# Patient Record
Sex: Male | Born: 1960 | Race: White | Hispanic: No | Smoking: Never smoker
Health system: Southern US, Community
[De-identification: ages and names within clinical notes are randomized; demographics above are authoritative.]

## PROBLEM LIST (undated history)

## (undated) DIAGNOSIS — I209 Angina pectoris, unspecified: Secondary | ICD-10-CM

## (undated) DIAGNOSIS — I38 Endocarditis, valve unspecified: Secondary | ICD-10-CM

## (undated) DIAGNOSIS — R531 Weakness: Secondary | ICD-10-CM

## (undated) DIAGNOSIS — I10 Essential (primary) hypertension: Secondary | ICD-10-CM

## (undated) HISTORY — PX: CARDIAC CATHETERIZATION: SHX172

## (undated) HISTORY — PX: SPINAL CORD STIMULATOR IMPLANT: SHX2422

## (undated) HISTORY — PX: CHOLECYSTECTOMY: SHX55

## (undated) HISTORY — PX: KNEE SURGERY: SHX244

---

## 2015-05-16 ENCOUNTER — Emergency Department (HOSPITAL_COMMUNITY): Payer: Medicare Other

## 2015-05-16 ENCOUNTER — Observation Stay (HOSPITAL_COMMUNITY)
Admission: EM | Admit: 2015-05-16 | Discharge: 2015-05-19 | Disposition: A | Discharge status: Home / Self Care | Payer: Medicare Other | Attending: Internal Medicine | Admitting: Internal Medicine

## 2015-05-16 ENCOUNTER — Encounter (HOSPITAL_COMMUNITY): Payer: Self-pay | Admitting: Emergency Medicine

## 2015-05-16 DIAGNOSIS — E1165 Type 2 diabetes mellitus with hyperglycemia: Secondary | ICD-10-CM | POA: Diagnosis not present

## 2015-05-16 DIAGNOSIS — J45909 Unspecified asthma, uncomplicated: Secondary | ICD-10-CM | POA: Insufficient documentation

## 2015-05-16 DIAGNOSIS — I639 Cerebral infarction, unspecified: Secondary | ICD-10-CM | POA: Diagnosis present

## 2015-05-16 DIAGNOSIS — R072 Precordial pain: Secondary | ICD-10-CM | POA: Insufficient documentation

## 2015-05-16 DIAGNOSIS — G43911 Migraine, unspecified, intractable, with status migrainosus: Secondary | ICD-10-CM

## 2015-05-16 DIAGNOSIS — R079 Chest pain, unspecified: Secondary | ICD-10-CM | POA: Insufficient documentation

## 2015-05-16 DIAGNOSIS — I493 Ventricular premature depolarization: Secondary | ICD-10-CM | POA: Diagnosis not present

## 2015-05-16 DIAGNOSIS — H8122 Vestibular neuronitis, left ear: Secondary | ICD-10-CM | POA: Diagnosis not present

## 2015-05-16 DIAGNOSIS — R42 Dizziness and giddiness: Secondary | ICD-10-CM | POA: Insufficient documentation

## 2015-05-16 DIAGNOSIS — F17220 Nicotine dependence, chewing tobacco, uncomplicated: Secondary | ICD-10-CM | POA: Insufficient documentation

## 2015-05-16 DIAGNOSIS — R269 Unspecified abnormalities of gait and mobility: Secondary | ICD-10-CM | POA: Insufficient documentation

## 2015-05-16 DIAGNOSIS — Z720 Tobacco use: Secondary | ICD-10-CM | POA: Diagnosis present

## 2015-05-16 DIAGNOSIS — H8120 Vestibular neuronitis, unspecified ear: Secondary | ICD-10-CM | POA: Diagnosis present

## 2015-05-16 DIAGNOSIS — H933X9 Disorders of unspecified acoustic nerve: Secondary | ICD-10-CM | POA: Insufficient documentation

## 2015-05-16 DIAGNOSIS — R531 Weakness: Secondary | ICD-10-CM | POA: Diagnosis present

## 2015-05-16 DIAGNOSIS — R739 Hyperglycemia, unspecified: Secondary | ICD-10-CM | POA: Diagnosis present

## 2015-05-16 DIAGNOSIS — R0789 Other chest pain: Secondary | ICD-10-CM | POA: Insufficient documentation

## 2015-05-16 DIAGNOSIS — R030 Elevated blood-pressure reading, without diagnosis of hypertension: Secondary | ICD-10-CM | POA: Diagnosis not present

## 2015-05-16 DIAGNOSIS — G510 Bell's palsy: Secondary | ICD-10-CM | POA: Diagnosis not present

## 2015-05-16 DIAGNOSIS — I2510 Atherosclerotic heart disease of native coronary artery without angina pectoris: Secondary | ICD-10-CM | POA: Diagnosis not present

## 2015-05-16 DIAGNOSIS — M6289 Other specified disorders of muscle: Secondary | ICD-10-CM | POA: Diagnosis not present

## 2015-05-16 DIAGNOSIS — M62830 Muscle spasm of back: Secondary | ICD-10-CM | POA: Diagnosis not present

## 2015-05-16 DIAGNOSIS — R55 Syncope and collapse: Principal | ICD-10-CM | POA: Insufficient documentation

## 2015-05-16 DIAGNOSIS — E785 Hyperlipidemia, unspecified: Secondary | ICD-10-CM | POA: Insufficient documentation

## 2015-05-16 DIAGNOSIS — E119 Type 2 diabetes mellitus without complications: Secondary | ICD-10-CM | POA: Insufficient documentation

## 2015-05-16 DIAGNOSIS — K219 Gastro-esophageal reflux disease without esophagitis: Secondary | ICD-10-CM | POA: Insufficient documentation

## 2015-05-16 HISTORY — DX: Angina pectoris, unspecified: I20.9

## 2015-05-16 HISTORY — DX: Weakness: R53.1

## 2015-05-16 HISTORY — DX: Endocarditis, valve unspecified: I38

## 2015-05-16 HISTORY — DX: Essential (primary) hypertension: I10

## 2015-05-16 LAB — HEPATIC FUNCTION PANEL
ALBUMIN: 3.4 g/dL — AB (ref 3.5–5.0)
ALK PHOS: 74 U/L (ref 38–126)
ALT: 34 U/L (ref 17–63)
AST: 25 U/L (ref 15–41)
Bilirubin, Direct: 0.1 mg/dL (ref 0.1–0.5)
Indirect Bilirubin: 0.6 mg/dL (ref 0.3–0.9)
TOTAL PROTEIN: 6.5 g/dL (ref 6.5–8.1)
Total Bilirubin: 0.7 mg/dL (ref 0.3–1.2)

## 2015-05-16 LAB — CBC WITH DIFFERENTIAL/PLATELET
BASOS ABS: 0.1 10*3/uL (ref 0.0–0.1)
Basophils Relative: 1 %
EOS PCT: 2 %
Eosinophils Absolute: 0.2 10*3/uL (ref 0.0–0.7)
HEMATOCRIT: 45.6 % (ref 39.0–52.0)
HEMOGLOBIN: 15 g/dL (ref 13.0–17.0)
LYMPHS PCT: 24 %
Lymphs Abs: 2.4 10*3/uL (ref 0.7–4.0)
MCH: 30.2 pg (ref 26.0–34.0)
MCHC: 32.9 g/dL (ref 30.0–36.0)
MCV: 91.8 fL (ref 78.0–100.0)
Monocytes Absolute: 1.2 10*3/uL — ABNORMAL HIGH (ref 0.1–1.0)
Monocytes Relative: 12 %
NEUTROS ABS: 6.3 10*3/uL (ref 1.7–7.7)
NEUTROS PCT: 61 %
PLATELETS: 238 10*3/uL (ref 150–400)
RBC: 4.97 MIL/uL (ref 4.22–5.81)
RDW: 13.1 % (ref 11.5–15.5)
WBC: 10.1 10*3/uL (ref 4.0–10.5)

## 2015-05-16 LAB — BASIC METABOLIC PANEL
ANION GAP: 6 (ref 5–15)
BUN: 14 mg/dL (ref 6–20)
CHLORIDE: 108 mmol/L (ref 101–111)
CO2: 25 mmol/L (ref 22–32)
Calcium: 9.1 mg/dL (ref 8.9–10.3)
Creatinine, Ser: 0.92 mg/dL (ref 0.61–1.24)
GFR calc Af Amer: >60 mL/min (ref >60)
GLUCOSE: 119 mg/dL — AB (ref 65–99)
POTASSIUM: 3.9 mmol/L (ref 3.5–5.1)
Sodium: 139 mmol/L (ref 135–145)

## 2015-05-16 LAB — GLUCOSE, CAPILLARY
Glucose-Capillary: 137 mg/dL — ABNORMAL HIGH (ref 65–99)
Glucose-Capillary: 180 mg/dL — ABNORMAL HIGH (ref 65–99)

## 2015-05-16 LAB — TROPONIN I
Troponin I: <0.03 ng/mL (ref <0.031)
Troponin I: <0.03 ng/mL (ref <0.031)

## 2015-05-16 LAB — CBG MONITORING, ED: GLUCOSE-CAPILLARY: 125 mg/dL — AB (ref 65–99)

## 2015-05-16 LAB — TSH: TSH: 0.928 u[IU]/mL (ref 0.350–4.500)

## 2015-05-16 MED ORDER — SODIUM CHLORIDE 0.9 % IV SOLN
INTRAVENOUS | Status: DC
  Administered 2015-05-16 – 2015-05-19 (×3): via INTRAVENOUS

## 2015-05-16 MED ORDER — ONDANSETRON HCL 4 MG/2ML IJ SOLN
4.0000 mg | Freq: Once | INTRAMUSCULAR | Status: AC
  Administered 2015-05-16: 4 mg via INTRAVENOUS
  Filled 2015-05-16: qty 2

## 2015-05-16 MED ORDER — VALACYCLOVIR HCL 500 MG PO TABS
1000.0000 mg | ORAL_TABLET | Freq: Three times a day (TID) | ORAL | Status: DC
  Administered 2015-05-16 – 2015-05-19 (×8): 1000 mg via ORAL
  Filled 2015-05-16 (×8): qty 2

## 2015-05-16 MED ORDER — ENOXAPARIN SODIUM 40 MG/0.4ML ~~LOC~~ SOLN
40.0000 mg | SUBCUTANEOUS | Status: DC
  Administered 2015-05-16 – 2015-05-18 (×3): 40 mg via SUBCUTANEOUS
  Filled 2015-05-16 (×2): qty 0.4

## 2015-05-16 MED ORDER — SODIUM CHLORIDE 0.9 % IJ SOLN
3.0000 mL | Freq: Two times a day (BID) | INTRAMUSCULAR | Status: DC
  Administered 2015-05-18: 3 mL via INTRAVENOUS

## 2015-05-16 MED ORDER — ASPIRIN EC 325 MG PO TBEC
325.0000 mg | DELAYED_RELEASE_TABLET | Freq: Every day | ORAL | Status: DC
  Administered 2015-05-17 – 2015-05-19 (×3): 325 mg via ORAL
  Filled 2015-05-16 (×3): qty 1

## 2015-05-16 MED ORDER — HYDROCODONE-ACETAMINOPHEN 5-325 MG PO TABS
1.0000 | ORAL_TABLET | ORAL | Status: DC | PRN
  Administered 2015-05-17: 1 via ORAL
  Filled 2015-05-16: qty 1

## 2015-05-16 MED ORDER — MECLIZINE HCL 25 MG PO TABS
25.0000 mg | ORAL_TABLET | Freq: Once | ORAL | Status: AC
  Administered 2015-05-16: 25 mg via ORAL
  Filled 2015-05-16: qty 1

## 2015-05-16 MED ORDER — NITROGLYCERIN 0.4 MG SL SUBL: 0.4000 mg | SUBLINGUAL_TABLET | SUBLINGUAL | Status: DC | PRN

## 2015-05-16 MED ORDER — DEXTROSE 5 % IV SOLN
125.0000 mg | Freq: Four times a day (QID) | INTRAVENOUS | Status: DC
  Filled 2015-05-16 (×3): qty 1.25

## 2015-05-16 MED ORDER — NITROGLYCERIN IN D5W 200-5 MCG/ML-% IV SOLN
0.0000 ug/min | Freq: Once | INTRAVENOUS | Status: AC
  Administered 2015-05-16: 5 ug/min via INTRAVENOUS
  Filled 2015-05-16: qty 250

## 2015-05-16 MED ORDER — SODIUM CHLORIDE 0.9 % IJ SOLN: 3.0000 mL | Freq: Two times a day (BID) | INTRAMUSCULAR | Status: DC

## 2015-05-16 MED ORDER — BACLOFEN 5 MG HALF TABLET
5.0000 mg | ORAL_TABLET | Freq: Three times a day (TID) | ORAL | Status: DC
  Filled 2015-05-16 (×2): qty 1

## 2015-05-16 MED ORDER — POLYETHYLENE GLYCOL 3350 17 G PO PACK
17.0000 g | PACK | Freq: Every day | ORAL | Status: DC | PRN
  Administered 2015-05-17 – 2015-05-18 (×2): 17 g via ORAL
  Filled 2015-05-16 (×2): qty 1

## 2015-05-16 MED ORDER — BACLOFEN 10 MG PO TABS: 5.0000 mg | ORAL_TABLET | Freq: Three times a day (TID) | ORAL | Status: DC | PRN

## 2015-05-16 MED ORDER — KETOROLAC TROMETHAMINE 15 MG/ML IJ SOLN
15.0000 mg | Freq: Once | INTRAMUSCULAR | Status: AC
  Administered 2015-05-16: 15 mg via INTRAVENOUS
  Filled 2015-05-16: qty 1

## 2015-05-16 MED ORDER — SODIUM CHLORIDE 0.9 % IJ SOLN: 3.0000 mL | INTRAMUSCULAR | Status: DC | PRN

## 2015-05-16 MED ORDER — ACETAMINOPHEN 325 MG PO TABS: 650.0000 mg | ORAL_TABLET | Freq: Four times a day (QID) | ORAL | Status: DC | PRN

## 2015-05-16 MED ORDER — VALPROATE SODIUM 500 MG/5ML IV SOLN
125.0000 mg | Freq: Four times a day (QID) | INTRAVENOUS | Status: DC | PRN
  Filled 2015-05-16 (×2): qty 1.25

## 2015-05-16 MED ORDER — VALACYCLOVIR HCL 500 MG PO TABS: 1000.0000 mg | ORAL_TABLET | Freq: Three times a day (TID) | ORAL | Status: DC

## 2015-05-16 MED ORDER — SODIUM CHLORIDE 0.9 % IV SOLN
250.0000 mg | Freq: Four times a day (QID) | INTRAVENOUS | Status: DC
  Filled 2015-05-16 (×4): qty 2

## 2015-05-16 MED ORDER — MECLIZINE HCL 25 MG PO TABS
25.0000 mg | ORAL_TABLET | Freq: Three times a day (TID) | ORAL | Status: DC | PRN
  Administered 2015-05-18: 25 mg via ORAL
  Filled 2015-05-16 (×4): qty 1

## 2015-05-16 MED ORDER — SODIUM CHLORIDE 0.9 % IV SOLN
250.0000 mg | Freq: Four times a day (QID) | INTRAVENOUS | Status: AC
  Administered 2015-05-16 – 2015-05-17 (×4): 250 mg via INTRAVENOUS
  Filled 2015-05-16 (×4): qty 2

## 2015-05-16 MED ORDER — SODIUM CHLORIDE 0.9 % IV SOLN: 250.0000 mL | INTRAVENOUS | Status: DC | PRN

## 2015-05-16 MED ORDER — FAMOTIDINE 20 MG PO TABS
20.0000 mg | ORAL_TABLET | Freq: Two times a day (BID) | ORAL | Status: DC
  Administered 2015-05-16 – 2015-05-19 (×7): 20 mg via ORAL
  Filled 2015-05-16 (×7): qty 1

## 2015-05-16 MED ORDER — BISACODYL 10 MG RE SUPP
10.0000 mg | Freq: Every day | RECTAL | Status: DC | PRN
  Filled 2015-05-16: qty 1

## 2015-05-16 MED ORDER — IOHEXOL 350 MG/ML SOLN
80.0000 mL | Freq: Once | INTRAVENOUS | Status: AC | PRN
  Administered 2015-05-16: 80 mL via INTRAVENOUS

## 2015-05-16 MED ORDER — INSULIN ASPART 100 UNIT/ML ~~LOC~~ SOLN
0.0000 [IU] | Freq: Three times a day (TID) | SUBCUTANEOUS | Status: DC
  Administered 2015-05-17 (×2): 3 [IU] via SUBCUTANEOUS

## 2015-05-16 MED ORDER — ACETAMINOPHEN 650 MG RE SUPP: 650.0000 mg | Freq: Four times a day (QID) | RECTAL | Status: DC | PRN

## 2015-05-16 NOTE — Consult Note (Signed)
NEURO HOSPITALIST CONSULT NOTE   Requesting physician: Dr. Sunnie Nielsen    Reason for Consult: Acute vertigo, gait instability, left facial paresthesias, rule out stroke  HPI:                                                                                                                                          Dennis Gonzales is an 54 y.o. male with a spinal cord stimulator for pain control secondary to a prior accident with spine injury. He woke up this morning at about 4 AM having severe gait instability and vertigo symptoms. As the day progressed she started noticing paresthesias and altered feeling on the left side of his face. At the same time he has also been having chest pain symptoms, which she describes as being diffuse on the left paracentral area and constant for several hours worse with any movement and on palpation.  Denies vision symptoms, no speech problems he has generalized fatigue and weakness without focal weakness in upper or lower extremities and no bowel or bladder problems.  Reported having some fullness feeling in his left ear.   Past Medical History  Diagnosis Date  . Angina pectoris (HCC)   . Leaky heart valve     Past Surgical History  Procedure Laterality Date  . Cardiac catheterization    . Cholecystectomy    . Spinal cord stimulator implant    . Knee surgery      History reviewed. No pertinent family history.  Family History: Mother  Father   Social History:  reports that he has never smoked. His smokeless tobacco use includes Chew. He reports that he does not drink alcohol. His drug history is not on file.  No Known Allergies  MEDICATIONS:                                                                                                                     Prior to Admission:  (Not in a hospital admission) Scheduled: . baclofen  5 mg Oral TID  . famotidine  20 mg Oral BID  . ketorolac  15 mg Intravenous Once  . methylPREDNISolone  (SOLU-MEDROL) injection  250 mg Intravenous Q6H  . valACYclovir  1,000 mg Oral TID   Continuous: . valproate sodium  Current facility-administered medications:  .  baclofen (LIORESAL) tablet 5 mg, 5 mg, Oral, TID, Honor Fairbank Daniel Nones, MD .  famotidine (PEPCID) tablet 20 mg, 20 mg, Oral, BID, Ellene Bloodsaw Daniel Nones, MD .  ketorolac (TORADOL) 15 MG/ML injection 15 mg, 15 mg, Intravenous, Once, Shelisa Fern Daniel Nones, MD .  methylPREDNISolone sodium succinate (SOLU-MEDROL) 250 mg in sodium chloride 0.9 % 50 mL IVPB, 250 mg, Intravenous, Q6H, Leandrew Keech Daniel Nones, MD .  valACYclovir (VALTREX) tablet 1,000 mg, 1,000 mg, Oral, TID, Chanta Bauers Daniel Nones, MD .  valproate (DEPACON) 125 mg in dextrose 5 % 50 mL IVPB, 125 mg, Intravenous, 4 times per day, Smriti Barkow Daniel Nones, MD No current outpatient prescriptions on file.    ROS:                                                                                                                                       History obtained from the patient  General ROS: Positive for - fatigue,  Psychological ROS: negative for - behavioral disorder, hallucinations, memory difficulties, mood swings or suicidal ideation Ophthalmic ROS: negative for - blurry vision, double vision, eye pain or loss of vision ENT ROS: negative for - epistaxis, nasal discharge, oral lesions, sore throat, tinnitus or vertigo Allergy and Immunology ROS: negative for - hives or itchy/watery eyes Hematological and Lymphatic ROS: negative for - bleeding problems, bruising or swollen lymph nodes Endocrine ROS: negative for - galactorrhea, hair pattern changes, polydipsia/polyuria or temperature intolerance Respiratory ROS: negative for - cough, hemoptysis, shortness of breath or wheezing Cardiovascular ROS: Positive for - chest pain,  Gastrointestinal ROS: negative for - abdominal pain, diarrhea, hematemesis, nausea/vomiting or stool  incontinence Genito-Urinary ROS: negative for - dysuria, hematuria, incontinence or urinary frequency/urgency Musculoskeletal ROS: Positive for - generalized muscular skeletal pain and back pain Neurological ROS: as noted in HPI Dermatological ROS: negative for rash and skin lesion changes   Blood pressure 131/89, pulse 75, temperature 98 F (36.7 C), temperature source Oral, resp. rate 17, height 6\' 1"  (1.854 m), weight 111.131 kg (245 lb), SpO2 97 %.   Neurologic Examination:                                                                                                      HEENT-  Normocephalic, no lesions, without obvious abnormality.  Normal external eye and conjunctiva.   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   Neurological Examination  Mental Status: Alert, oriented, thought content appropriate.  Speech fluent without evidence of aphasia.  Able to follow 3 step commands without difficulty. Cranial Nerves: II:    pupils equal, round, reactive to light and accommodation III,IV, VI: ptosis not present, extra-ocular motions intact bilaterally V,VII: smile symmetric, facial light touch sensation normal bilaterally. He has mild weakness with left orbicularis oculi muscle during eye closure suggestive of a mild low motor neuron left facial weakness.  VIII: hearing normal bilaterally IX,X: uvula rises symmetrically XI: bilateral shoulder shrug XII: midline tongue extension Motor: Right : Upper extremity   5/5    Left:     Upper extremity   5/5  Lower extremity   5/5     Lower extremity   5/5 Tone and bulk:normal tone throughout; no atrophy noted Sensory: Pinprick and light touch intact throughout, bilaterally including the face Deep Tendon Reflexes: 2+ and symmetric throughout Plantars: Right: downgoing   Left: downgoing Cerebellar: normal finger-to-nose, no evidence of appendicular ataxia noted Gait: Deferred      Lab Results: Basic  Metabolic Panel:  Recent Labs Lab 05/16/15 1117  NA 139  K 3.9  CL 108  CO2 25  GLUCOSE 119*  BUN 14  CREATININE 0.92  CALCIUM 9.1    Liver Function Tests: No results for input(s): AST, ALT, ALKPHOS, BILITOT, PROT, ALBUMIN in the last 168 hours. No results for input(s): LIPASE, AMYLASE in the last 168 hours. No results for input(s): AMMONIA in the last 168 hours.  CBC:  Recent Labs Lab 05/16/15 1117  WBC 10.1  NEUTROABS 6.3  HGB 15.0  HCT 45.6  MCV 91.8  PLT 238    Cardiac Enzymes:  Recent Labs Lab 05/16/15 1117  TROPONINI <0.03    Lipid Panel: No results for input(s): CHOL, TRIG, HDL, CHOLHDL, VLDL, LDLCALC in the last 168 hours.  CBG:  Recent Labs Lab 05/16/15 1031  GLUCAP 125*    Microbiology: No results found for this or any previous visit.  Coagulation Studies: No results for input(s): LABPROT, INR in the last 72 hours.  Imaging: Ct Head Wo Contrast  05/16/2015  CLINICAL DATA:  Syncope, dizziness, chest and nausea. Left-sided numbness. EXAM: CT HEAD WITHOUT CONTRAST TECHNIQUE: Contiguous axial images were obtained from the base of the skull through the vertex without intravenous contrast. COMPARISON:  None. FINDINGS: No evidence of an acute infarct, acute hemorrhage, mass lesion, mass effect or hydrocephalus. No air-fluid levels in the visualized portions of the paranasal sinuses or mastoid air cells. No fracture. Brain was incidentally imaged post contrast administration for CT chest the same day. No areas of abnormal enhancement. IMPRESSION: No acute intracranial abnormality. Electronically Signed   By: Leanna Battles M.D.   On: 05/16/2015 12:23     Assessment/Plan: 54 year old male Patient with acute onset severe vertigo and gait instability when he woke up this morning. 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  CT of the brain, CT angiogram of the head and neck done in the ER were negative for any acute pathology. MRI could not be performed due to spinal cord stimulator.   For vestibular and left facial neuritis management, recommend IV Solu-Medrol 250 mg every 6 hours for 24 hours, due to the severity of symptoms at this time. If his symptoms improve by tomorrow, can switch to by mouth  Medrol Dosepak before discharge home. Also recommend valacyclovir by mouth 1 g 3 times a day for 7 days. Patient is been having severe migraine headache with associated photophobia which is been intractable, and associated neck and low back muscle spasms. Recommend starting IV Depacon 125 mg every 6 hours and baclofen 5 mg 3 times a day. With improved symptoms, Depacon can be switched to by mouth Depakote 250 mg twice a day for migraine prophylaxis prior to discharge and may continue baclofen 5 mg 3 times a day, if tolerated without sedative side effects.  Recommend physical therapy evaluation tomorrow prior to discharge home.  He will have cardiac workup for chest pain through the primary hospitalist team. We will continue to follow-up.

## 2015-05-16 NOTE — Progress Notes (Signed)
NURSING PROGRESS NOTE  Dennis PerchesMark Gonzales [id number] Admission Data: 05/16/2015 4:38 PM Attending Provider: Alba CoryBelkys A Regalado, MD PCP:No primary care provider on file. Code Status: FULL   Dennis PerchesMark Gonzales is a 54 y.o. male patient admitted from ED:  -No acute distress noted.  -No complaints of shortness of breath.  -No complaints of chest pain.   Cardiac Monitoring: Box #  in place. Cardiac monitor yields:  Blood pressure 131/88, pulse 78, temperature 98 F (36.7 C), temperature source Oral, resp. rate 23, height 6\' 1"  (1.854 m), weight 111.131 kg (245 lb), SpO2 97 %.   IV Fluids:  IV in place, occlusive dsg intact without redness, IV cath antecubital left, condition patent and no redness none.   Allergies:  Review of patient's allergies indicates no known allergies.  Past Medical History:   has a past medical history of Angina pectoris (HCC); Leaky heart valve; Weakness (05/16/2015 ); and Hypertension.  Past Surgical History:   has past surgical history that includes Cardiac catheterization; Cholecystectomy; Spinal cord stimulator implant; and Knee surgery.  Social History:   reports that he has never smoked. His smokeless tobacco use includes Chew. He reports that he does not drink alcohol or use illicit drugs.  Skin: Intact  Patient/Family orientated to room. Information packet given to patient/family. Admission inpatient armband information verified with patient/family to include name and date of birth and placed on patient arm. Side rails up x 2, fall assessment and education completed with patient/family. Patient/family able to verbalize understanding of risk associated with falls and verbalized understanding to call for assistance before getting out of bed. Call light within reach. Patient/family able to voice and demonstrate understanding of unit orientation instructions.    Will continue to evaluate and treat per MD orders.  Dennis Pieriniyndi Lejend Dalby, RN

## 2015-05-16 NOTE — H&P (Signed)
[facility] History and Physical                                                                                    Dennis Gonzales, is a 54 y.o. male  MRN: [id number]   DOB - 1960/12/07  Admit Date - 05/16/2015  Outpatient Primary MD for the patient is No primary care provider on file.  Referring Physician:  Dr. Fayrene Fearing, EDP  Chief Complaint:   Chief Complaint  Patient presents with  . Near Syncope     HPI  Dennis Gonzales  is a 54 y.o. male, with a history of angina and an accident that shattered spinal vertebrae and necessitated a mechanical spinal stimulator to be implanted. He presents to the ER today with severe vertigo, chest pain, and possible syncopal episode. The patient reports that over the past week he has had intermittent stinging in his left chest. He states it felt like a bee was stinging his heart. At 4:30 this morning he awoke to urinate and felt the room spinning around him. He got up later in the morning progressed on and went to computer science class. The vertigo persisted intermittently during class, but when he got up to hand in an assignment he developed severe chest pain and vertigo simultaneously. The chest pain felt like he was being jabbed with a knife. He became dizzy and was caught by one of his classmates before he hit the ground. When he woke up he was lying on his back.  He developed left-sided facial numbness which progressed into both weakness and numbness in his left face, arm, and leg.   He was brought to the ER and placed on a nitro drip. His chest pain has resolved.  His labs are essentially normal. CT head and CTA head and neck were negative for acute stroke. MR was unable to be performed due to an implanted spinal stimulator. 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    Review of Systems  HENT: Negative.   Eyes: Negative.   Respiratory: Negative.   Cardiovascular: Positive for chest pain.   Gastrointestinal: Negative.   Genitourinary: Negative.   Musculoskeletal: Positive for back pain.  Skin: Negative.   Neurological: Positive for dizziness, sensory change, focal weakness and weakness.  Endo/Heme/Allergies: Negative.   Psychiatric/Behavioral: Negative.      Past Medical History  Past Medical History  Diagnosis Date  . Angina pectoris (HCC)   . Leaky heart valve     Past Surgical History  Procedure Laterality Date  . Cardiac catheterization    . Cholecystectomy    . Spinal cord stimulator implant    . Knee surgery        Social History Social History  Substance Use Topics  . Smoking status: Never Smoker   . Smokeless tobacco: Current User    Types: Chew  . 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  Married.  Family History Father (at 44) and grandfather died with aortic aneurysms, grand mother died with stroke.  Prior to Admission medications   Not on  File    No Known Allergies  Physical Exam  Vitals  Blood pressure 119/88, pulse 75, temperature 98 F (36.7 C), temperature source Oral, resp. rate 28, height  (1.854 m), weight 111.131 kg (245 lb), SpO2 97 %.   General:  Well-developed, obese male lying in bed in NAD  Psych:  Normal affect and insight, Not Suicidal or Homicidal, Awake Alert, Oriented X 3.  Neuro:   Slight left-sided facial droop, decreased sensation in left face left arm left leg, decreased strength in left leg and left arm  ENT:  Ears and Eyes appear Normal, Conjunctivae clear, PER. Moist oral mucosa without erythema or exudates.  Neck:  Supple, No lymphadenopathy appreciated  Respiratory:  Symmetrical chest wall movement, Good air movement bilaterally, CTAB.  Cardiac:  RRR, No Murmurs, no LE edema noted, no JVD.    Abdomen:  Positive bowel sounds, Soft, Non tender, Non distended,  No masses appreciated  Skin:  No Cyanosis, Normal Skin Turgor, No Skin Rash or  Bruise.  Extremities:  Able to move all 4. 4/5 strength in each left leg and left arm,  no effusions.  Data Review  Wt Readings from Last 3 Encounters:  05/16/15 111.131 kg (245 lb)    CBC  Recent Labs Lab 05/16/15 1117  WBC 10.1  HGB 15.0  HCT 45.6  PLT 238  MCV 91.8  MCH 30.2  MCHC 32.9  RDW 13.1  LYMPHSABS 2.4  MONOABS 1.2*  EOSABS 0.2  BASOSABS 0.1    Chemistries   Recent Labs Lab 05/16/15 1117  NA 139  K 3.9  CL 108  CO2 25  GLUCOSE 119*  BUN 14  CREATININE 0.92  CALCIUM 9.1     Cardiac Enzymes  Recent Labs Lab 05/16/15 1117  TROPONINI <0.03     Imaging results:   Ct Angio Head W/cm &/or Wo Cm  05/16/2015  CLINICAL DATA:  The patient awoke with vertigo at 4:15 a.m. Numbness in the left side of the face and arm. Anterior chest pain. EXAM: CT ANGIOGRAPHY HEAD AND NECK TECHNIQUE: Multidetector CT imaging of the head and neck was performed using the standard protocol during bolus administration of intravenous contrast. Multiplanar CT image reconstructions and MIPs were obtained to evaluate the vascular anatomy. Carotid stenosis measurements (when applicable) are obtained utilizing NASCET criteria, using the distal internal carotid diameter as the denominator. CONTRAST:  80mL OMNIPAQUE IOHEXOL 350 MG/ML SOLN COMPARISON:  Noncontrast at CT from the same day. FINDINGS: CT HEAD Brain: The source images demonstrate no acute or subacute infarct. The basal ganglia are intact. The insular ribbon is within normal limits. No acute cortical infarct is present. No focal cerebellar lesions are evident. Calvarium and skull base: Within normal limits Paranasal sinuses: Clear Orbits: Unremarkable CTA NECK Aortic arch: A 3 vessel arch configuration is present. There is no focal stenosis or atherosclerotic calcification at the aorta. Right carotid system: The right common carotid artery is within normal limits. The bifurcation is unremarkable. The cervical right ICA is  normal. Left carotid system: The left common carotid artery is mildly tortuous. There is no focal stenosis. Atherosclerotic calcifications are present at the left carotid bifurcation without a significant stenosis relative to the more distal vessel. The cervical left ICA is normal. Vertebral arteries:The vertebral arteries originate from the subclavian arteries bilaterally. The vertebral arteries appear to be codominant. There is no significant stenosis or focal vascular injury in the neck. The right PICA originates just below the dural margin. Skeleton: Bone  windows demonstrate multilevel uncovertebral spurring osseous foraminal narrowing is present on the left at C3-4 and C4-5 and on the right at C3-4 and C5-6. No focal lytic or blastic lesions are present. Other neck: No focal mucosal or submucosal lesions are present. The vocal cords are midline and symmetric. The tongue base is within normal limits. The salivary glands are intact. No significant adenopathy is present. The lung apices are clear. CTA HEAD Anterior circulation: The internal carotid arteries are within normal limits at the skullbase bilaterally. The ICA termini are unremarkable. The A1 and M1 segments are normal. The anterior communicating artery is patent. MCA bifurcations are intact bilaterally. The ACA and MCA branch vessels are within normal limits. Posterior circulation: The left PICA origin is visualized and normal. The vertebrobasilar junction is within normal limits. Posterior cerebral arteries contribute to the posterior cerebral arteries bilaterally. The PCA branch vessels are within normal limits bilaterally. Venous sinuses: The dural sinuses are patent. The straight sinus and deep cerebral veins are within normal limits bilaterally. Anatomic variants: None Delayed phase: The postcontrast images demonstrate no pathologic enhancement. IMPRESSION: 1. Minimal atherosclerotic changes at the left carotid bifurcation without significant  stenosis. 2. No other focal atherosclerotic disease or stenosis. 3. Normal variant CTA circle of Willis without evidence for significant proximal stenosis, aneurysm, or branch vessel occlusion. 4. Mild degenerate changes in the cervical spine as described. Electronically Signed   By: Marin Roberts M.D.   On: 05/16/2015 13:02   Ct Head Wo Contrast  05/16/2015  CLINICAL DATA:  Syncope, dizziness, chest and nausea. Left-sided numbness. EXAM: CT HEAD WITHOUT CONTRAST TECHNIQUE: Contiguous axial images were obtained from the base of the skull through the vertex without intravenous contrast. COMPARISON:  None. FINDINGS: No evidence of an acute infarct, acute hemorrhage, mass lesion, mass effect or hydrocephalus. No air-fluid levels in the visualized portions of the paranasal sinuses or mastoid air cells. No fracture. Brain was incidentally imaged post contrast administration for CT chest the same day. No areas of abnormal enhancement. IMPRESSION: No acute intracranial abnormality. Electronically Signed   By: Leanna Battles M.D.   On: 05/16/2015 12:23   Ct Angio Neck W/cm &/or Wo/cm  05/16/2015  CLINICAL DATA:  The patient awoke with vertigo at 4:15 a.m. Numbness in the left side of the face and arm. Anterior chest pain. EXAM: CT ANGIOGRAPHY HEAD AND NECK TECHNIQUE: Multidetector CT imaging of the head and neck was performed using the standard protocol during bolus administration of intravenous contrast. Multiplanar CT image reconstructions and MIPs were obtained to evaluate the vascular anatomy. Carotid stenosis measurements (when applicable) are obtained utilizing NASCET criteria, using the distal internal carotid diameter as the denominator. CONTRAST:  80mL OMNIPAQUE IOHEXOL 350 MG/ML SOLN COMPARISON:  Noncontrast at CT from the same day. FINDINGS: CT HEAD Brain: The source images demonstrate no acute or subacute infarct. The basal ganglia are intact. The insular ribbon is within normal limits. No acute  cortical infarct is present. No focal cerebellar lesions are evident. Calvarium and skull base: Within normal limits Paranasal sinuses: Clear Orbits: Unremarkable CTA NECK Aortic arch: A 3 vessel arch configuration is present. There is no focal stenosis or atherosclerotic calcification at the aorta. Right carotid system: The right common carotid artery is within normal limits. The bifurcation is unremarkable. The cervical right ICA is normal. Left carotid system: The left common carotid artery is mildly tortuous. There is no focal stenosis. Atherosclerotic calcifications are present at the left carotid bifurcation without a significant stenosis  relative to the more distal vessel. The cervical left ICA is normal. Vertebral arteries:The vertebral arteries originate from the subclavian arteries bilaterally. The vertebral arteries appear to be codominant. There is no significant stenosis or focal vascular injury in the neck. The right PICA originates just below the dural margin. Skeleton: Bone windows demonstrate multilevel uncovertebral spurring osseous foraminal narrowing is present on the left at C3-4 and C4-5 and on the right at C3-4 and C5-6. No focal lytic or blastic lesions are present. Other neck: No focal mucosal or submucosal lesions are present. The vocal cords are midline and symmetric. The tongue base is within normal limits. The salivary glands are intact. No significant adenopathy is present. The lung apices are clear. CTA HEAD Anterior circulation: The internal carotid arteries are within normal limits at the skullbase bilaterally. The ICA termini are unremarkable. The A1 and M1 segments are normal. The anterior communicating artery is patent. MCA bifurcations are intact bilaterally. The ACA and MCA branch vessels are within normal limits. Posterior circulation: The left PICA origin is visualized and normal. The vertebrobasilar junction is within normal limits. Posterior cerebral arteries contribute to  the posterior cerebral arteries bilaterally. The PCA branch vessels are within normal limits bilaterally. Venous sinuses: The dural sinuses are patent. The straight sinus and deep cerebral veins are within normal limits bilaterally. Anatomic variants: None Delayed phase: The postcontrast images demonstrate no pathologic enhancement. IMPRESSION: 1. Minimal atherosclerotic changes at the left carotid bifurcation without significant stenosis. 2. No other focal atherosclerotic disease or stenosis. 3. Normal variant CTA circle of Willis without evidence for significant proximal stenosis, aneurysm, or branch vessel occlusion. 4. Mild degenerate changes in the cervical spine as described. Electronically Signed   By: Marin Roberts M.D.   On: 05/16/2015 13:02    My personal review of EKG: Sinus. QTC is not prolonged. Some ST changes in V2 V3 and V4. 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  Patient denies ever having chickenpox, shingles or herpes Patient was assessed by neurology who felt these symptoms were due to labyrinthitis. Unable to do MR due to spinal stimulator. CT angios head and neck shows some atherosclerotic changes in the left carotid bifurcation without significant stenosis. Will check 2-D echo, hemoglobin A1c, fasting lipids, TSH, PT/OT/speech eval. Place on 325 mg aspirin. Per neurology: Solu-Medrol 24 hours then Medrol dose pack, Valtrex 7 days. Depakote for headache, baclofen for neck pain. Have ordered diazepam PRN for vertigo.   Chest pain Sounds atypical. Could be musculoskeletal in nature.  Patient was placed on a nitro drip in there ER.  Troponin is normal Will cycle enzymes, 2-D echo, start aspirin. Recheck EKG.   Ongoing tobacco abuse He uses a tin of  "grizzly" every couple of days Will counsel cessation.     Consultants Called:    Neurology consultation appreciated.  Family Communication:    Patient is alert, orientated and understands their plan of care.  Code Status:    Full  Condition:    Guarded.  Potential Disposition:   To home 11/29 if work up is complete and vertigo is improved.  Time spent in minutes : [address]   [facility] [place]s,  [place] on 05/16/2015 at 2:10 PM Between 7am to 7pm - Pager -  [phone] After 7pm go to [email or web address] - password [email or web address] And look for the night coverage person covering me after hours

## 2015-05-16 NOTE — ED Notes (Signed)
EDP made aware of dizziness and L-sided numbness.

## 2015-05-16 NOTE — ED Notes (Signed)
EDP at bedside  

## 2015-05-16 NOTE — ED Provider Notes (Addendum)
CSN: [id number]     Arrival date & time    History   First MD Initiated Contact with Patient 05/16/15 1027     Chief Complaint  Patient presents with  . Near Syncope      HPI  Patient presents for evaluation of dizziness chest pain and nausea.  Felt well upon going to bed last night other than he states he felt some weakness and a poor appetite. He waking this morning at 04 15. He sat up. He immediately had a sensation that the room was spinning. He had to lay back down for a few minutes. He remains symptomatic, but was able to get himself to and from the bathroom and back to bed. Again awakened 6:15 and symptoms had improved. He is in school and went to his class this morning. Had intermittent episodes of vertigo with ambulation. He began to feel numb in his face and arm. He began to feel some generalized anterior chest pain is nonspecific. He presents here.  History of previous normal heart cath other than a valvular abnormality. No history of coronary artery disease. Lifetime nonsmoker. No history of hypertension diabetes.  Past Medical History  Diagnosis Date  . Angina pectoris (HCC)   . Leaky heart valve    Past Surgical History  Procedure Laterality Date  . Cardiac catheterization    . Cholecystectomy    . Spinal cord stimulator implant    . Knee surgery     History reviewed. No pertinent family history. Social History  Substance Use Topics  . Smoking status: Never Smoker   . Smokeless tobacco: Current User    Types: Chew  . Alcohol Use: No    Review of Systems  Constitutional: Negative for fever, chills, diaphoresis, appetite change and fatigue.  HENT: Negative for mouth sores, sore throat and trouble swallowing.   Eyes: Negative for visual disturbance.  Respiratory: Negative for cough, chest tightness, shortness of breath and wheezing.   Cardiovascular: Positive for chest pain.  Gastrointestinal: Positive for nausea. Negative for vomiting, abdominal pain, diarrhea  and abdominal distention.  Endocrine: Negative for polydipsia, polyphagia and polyuria.  Genitourinary: Negative for dysuria, frequency and hematuria.  Musculoskeletal: Negative for gait problem.  Skin: Negative for color change, pallor and rash.  Neurological: Positive for dizziness and numbness. Negative for syncope, light-headedness and headaches.  Hematological: Does not bruise/bleed easily.  Psychiatric/Behavioral: Negative for behavioral problems and confusion.      Allergies  Review of patient's allergies indicates no known allergies.  Home Medications   Prior to Admission medications   Not on File   BP 131/89 mmHg  Pulse 75  Temp(Src) 98 F (36.7 C) (Oral)  Resp 17  Ht  (1.854 m)  Wt 245 lb (111.131 kg)  BMI 32.33 kg/m2  SpO2 97% Physical Exam  Constitutional: He is oriented to person, place, and time. He appears well-developed and well-nourished. No distress.  HENT:  Head: Normocephalic.  Eyes: Conjunctivae are normal. Pupils are equal, round, and reactive to light. No scleral icterus.  Neck: Normal range of motion. Neck supple. No thyromegaly present.  Cardiovascular: Normal rate and regular rhythm.  Exam reveals no gallop and no friction rub.   No murmur heard. Pulmonary/Chest: Effort normal and breath sounds normal. No respiratory distress. He has no wheezes. He has no rales.  Abdominal: Soft. Bowel sounds are normal. He exhibits no distension. There is no tenderness. There is no rebound.  Musculoskeletal: Normal range of motion.  Neurological: He is  alert and oriented to person, place, and time.  Nystagmus to bilateral lateral gaze. Reports decreased sensation although slight to the left face V1 through V3. No pronator drift. Reports decreased sensation to left upper finger fingertips. Normal lower extremity neurological exam  Skin: Skin is warm and dry. No rash noted.  Psychiatric: He has a normal mood and affect. His behavior is normal.    ED Course   Procedures (including critical care time) Labs Review Labs Reviewed  CBC WITH DIFFERENTIAL/PLATELET - Abnormal; Notable for the following:    Monocytes Absolute 1.2 (*)    All other components within normal limits  BASIC METABOLIC PANEL - Abnormal; Notable for the following:    Glucose, Bld 119 (*)    All other components within normal limits  CBG MONITORING, ED - Abnormal; Notable for the following:    Glucose-Capillary 125 (*)    All other components within normal limits  TROPONIN I    Imaging Review Ct Head Wo Contrast  05/16/2015  CLINICAL DATA:  Syncope, dizziness, chest and nausea. Left-sided numbness. EXAM: CT HEAD WITHOUT CONTRAST TECHNIQUE: Contiguous axial images were obtained from the base of the skull through the vertex without intravenous contrast. COMPARISON:  None. FINDINGS: No evidence of an acute infarct, acute hemorrhage, mass lesion, mass effect or hydrocephalus. No air-fluid levels in the visualized portions of the paranasal sinuses or mastoid air cells. No fracture. Brain was incidentally imaged post contrast administration for CT chest the same day. No areas of abnormal enhancement. IMPRESSION: No acute intracranial abnormality. Electronically Signed   By: Leanna BattlesMelinda  Blietz M.D.   On: 05/16/2015 12:23   I have personally reviewed and evaluated these images and lab results as part of my medical decision-making.   EKG Interpretation None      MDM   Final diagnoses:  Weakness  Weakness    No acute stroke, or abnormal is on CT with CTA. Patient has spinal cord stimulator, thus MRI contraindicated. Seen and evaluated by neurology. Working diagnosis of bilateral labyrinthitis, and unilateral evolving Bell's palsy. He requests admission. Admission orders have been discussed. Call placed to unassigned medicine regarding disposition.    Rolland PorterMark Glendal Cassaday, MD 05/16/15 1249  Rolland PorterMark Georgeanna Radziewicz, MD 05/16/15 [phone]

## 2015-05-16 NOTE — ED Notes (Signed)
[facility]-Pt. Coming from school with a near syncopal episode today accompanied with chest pain and nausea. Pt. sts he had dizziness starting last night. Today he went to turn in an assignment and got dizziness and lowered himself to the floor. Dizziness is positional and chest pain was relieved by nitro en route. Pt. Also given 324 mg ASA en route. Pt. Reports having a lot of stress and anxiety lately from school work and family issues. Pt. Also reports some numbness in left arm. Pt. Has had an episode like this before and it was his gall bladder. EMS and school report pt. Was AOx4 throughout entire episode. Pt. Takes nitro at home for angina.

## 2015-05-16 NOTE — ED Notes (Signed)
Ordered pt a heart healthy lunch tray 

## 2015-05-17 ENCOUNTER — Inpatient Hospital Stay (HOSPITAL_COMMUNITY): Payer: Medicare Other

## 2015-05-17 ENCOUNTER — Other Ambulatory Visit (HOSPITAL_COMMUNITY): Payer: Medicare Other

## 2015-05-17 ENCOUNTER — Encounter (HOSPITAL_COMMUNITY): Payer: Self-pay | Admitting: Radiology

## 2015-05-17 DIAGNOSIS — R739 Hyperglycemia, unspecified: Secondary | ICD-10-CM | POA: Diagnosis not present

## 2015-05-17 DIAGNOSIS — M6289 Other specified disorders of muscle: Secondary | ICD-10-CM | POA: Diagnosis not present

## 2015-05-17 DIAGNOSIS — Z720 Tobacco use: Secondary | ICD-10-CM | POA: Diagnosis not present

## 2015-05-17 DIAGNOSIS — H8120 Vestibular neuronitis, unspecified ear: Secondary | ICD-10-CM | POA: Diagnosis present

## 2015-05-17 DIAGNOSIS — R079 Chest pain, unspecified: Secondary | ICD-10-CM

## 2015-05-17 DIAGNOSIS — R55 Syncope and collapse: Secondary | ICD-10-CM | POA: Diagnosis not present

## 2015-05-17 DIAGNOSIS — R072 Precordial pain: Secondary | ICD-10-CM | POA: Diagnosis not present

## 2015-05-17 DIAGNOSIS — H8122 Vestibular neuronitis, left ear: Secondary | ICD-10-CM | POA: Diagnosis not present

## 2015-05-17 LAB — BASIC METABOLIC PANEL
ANION GAP: 8 (ref 5–15)
BUN: 17 mg/dL (ref 6–20)
CHLORIDE: 107 mmol/L (ref 101–111)
CO2: 23 mmol/L (ref 22–32)
CREATININE: 1.04 mg/dL (ref 0.61–1.24)
Calcium: 9.2 mg/dL (ref 8.9–10.3)
GFR calc non Af Amer: >60 mL/min (ref >60)
Glucose, Bld: 214 mg/dL — ABNORMAL HIGH (ref 65–99)
POTASSIUM: 4.3 mmol/L (ref 3.5–5.1)
SODIUM: 138 mmol/L (ref 135–145)

## 2015-05-17 LAB — GLUCOSE, CAPILLARY
GLUCOSE-CAPILLARY: 226 mg/dL — AB (ref 65–99)
GLUCOSE-CAPILLARY: 267 mg/dL — AB (ref 65–99)
Glucose-Capillary: 219 mg/dL — ABNORMAL HIGH (ref 65–99)
Glucose-Capillary: 243 mg/dL — ABNORMAL HIGH (ref 65–99)

## 2015-05-17 LAB — HEMOGLOBIN A1C
HEMOGLOBIN A1C: 6.7 % — AB (ref 4.8–5.6)
MEAN PLASMA GLUCOSE: 146 mg/dL

## 2015-05-17 LAB — LIPID PANEL
CHOL/HDL RATIO: 5.1 ratio
CHOLESTEROL: 194 mg/dL (ref 0–200)
HDL: 38 mg/dL — AB (ref >40)
LDL Cholesterol: 141 mg/dL — ABNORMAL HIGH (ref 0–99)
TRIGLYCERIDES: 74 mg/dL (ref <150)
VLDL: 15 mg/dL (ref 0–40)

## 2015-05-17 LAB — TROPONIN I: Troponin I: <0.03 ng/mL (ref <0.031)

## 2015-05-17 MED ORDER — REGADENOSON 0.4 MG/5ML IV SOLN
0.4000 mg | Freq: Once | INTRAVENOUS | Status: AC
  Administered 2015-05-18: 0.4 mg via INTRAVENOUS
  Filled 2015-05-17: qty 5

## 2015-05-17 MED ORDER — INSULIN ASPART 100 UNIT/ML ~~LOC~~ SOLN
0.0000 [IU] | Freq: Every day | SUBCUTANEOUS | Status: DC
  Administered 2015-05-17: 3 [IU] via SUBCUTANEOUS
  Administered 2015-05-18: 2 [IU] via SUBCUTANEOUS

## 2015-05-17 MED ORDER — INSULIN ASPART 100 UNIT/ML ~~LOC~~ SOLN
0.0000 [IU] | Freq: Three times a day (TID) | SUBCUTANEOUS | Status: DC
  Administered 2015-05-17: 5 [IU] via SUBCUTANEOUS
  Administered 2015-05-18 (×3): 3 [IU] via SUBCUTANEOUS
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
## 2015-05-17 NOTE — Progress Notes (Signed)
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  CT of the brain, CT angiogram of the head and neck done in the ER were negative for any acute pathology. MRI could not be performed due to spinal cord stimulator.   patient has been standing up and try to walk to bathroom this morning when I entered the room. But he continues to report having unchanged dizziness symptoms and left-sided weakness. He expressed frustration for being unable to do an MRI study due to spinal cord stimulator.      Objective: Current vital signs: BP 145/80 mmHg  Pulse 76  Temp(Src) 97.8 F (36.6 C) (Oral)  Resp 14  Ht 6\' 1"  (1.854 m)  Wt 111.131 kg (245 lb)  BMI 32.33 kg/m2  SpO2 97% Vital signs in last 24 hours: Temp:  [97.8 F (36.6 C)-98.2 F (36.8 C)] 97.8 F (36.6 C) (11/29 0502) Pulse Rate:  [71-76] 76 (11/29 0502) Resp:  [14-16] 14 (11/29 0502) BP: (134-145)/(80-84) 145/80 mmHg (11/29 0502) SpO2:  [97 %-98 %] 97 % (11/29 0502)  Intake/Output from previous day: 11/28 0701 - 11/29 0700 In: 1004 [I.V.:900; IV Piggyback:104] Out: 350 [Urine:350] Intake/Output this shift:   Nutritional status: Diet Heart Room service appropriate?: Yes; Fluid consistency:: Thin Diet NPO time specified Except for: Ice Chips  Neurologic Exam:   Mental Status: Alert, oriented, thought content appropriate. Speech fluent without evidence of aphasia. Able to follow 3 step commands without difficulty. Cranial Nerves: II: pupils equal, round, reactive to light and accommodation III,IV, VI: ptosis not present, extra-ocular motions intact bilaterally V,VII: smile symmetric, facial light touch sensation normal bilaterally. He has mild weakness with left orbicularis oculi muscle during eye closure suggestive of a mild low motor neuron left facial weakness.  VIII:  hearing normal bilaterally IX,X: uvula rises symmetrically XI: bilateral shoulder shrug XII: midline tongue extension Motor: Right :Upper extremity 5/5Left: Upper extremity 5/5 Lower extremity 5/5Lower extremity 5/5 Tone and bulk:normal tone throughout; no atrophy noted Sensory: Pinprick and light touch intact throughout, bilaterally including the face Deep Tendon Reflexes: 2+ and symmetric throughout Plantars: Right: downgoingLeft: downgoing Cerebellar: normal finger-to-nose, no evidence of appendicular ataxia noted Gait: He is able to walk independently without any support, appears to have mild gait instability  Lab Results: Basic Metabolic Panel:  Recent Labs Lab 05/16/15 1117 05/17/15 0511  NA 139 138  K 3.9 4.3  CL 108 107  CO2 25 23  GLUCOSE 119* 214*  BUN 14 17  CREATININE 0.92 1.04  CALCIUM 9.1 9.2    Liver Function Tests:  Recent Labs Lab 05/16/15 1430  AST 25  ALT 34  ALKPHOS 74  BILITOT 0.7  PROT 6.5  ALBUMIN 3.4*   No results for input(s): LIPASE, AMYLASE in the last 168 hours. No results for input(s): AMMONIA in the last 168 hours.  CBC:  Recent Labs Lab 05/16/15 1117  WBC 10.1  NEUTROABS 6.3  HGB 15.0  HCT 45.6  MCV 91.8  PLT 238    Cardiac Enzymes:  Recent Labs Lab 05/16/15 1117 05/16/15 1430 05/16/15 1730 05/16/15 2220 05/17/15 0511  TROPONINI <0.03 <0.03 <0.03 <0.03 <0.03    Lipid Panel:  Recent Labs Lab 05/17/15 0511  CHOL 194  TRIG 74  HDL 38*  CHOLHDL 5.1  VLDL 15  LDLCALC 161141*    CBG:  Recent Labs Lab 05/16/15 1802 05/16/15 2118  05/17/15 0744 05/17/15 1209 05/17/15 1733  GLUCAP 137* 180* 219* 226* 243*    Microbiology: No results found for this or any previous visit.  Coagulation Studies: No results for input(s): LABPROT, INR in the last 72  hours.  Imaging: Ct Angio Head W/cm &/or Wo Cm  05/16/2015  CLINICAL DATA:  The patient awoke with vertigo at 4:15 a.m. Numbness in the left side of the face and arm. Anterior chest pain. EXAM: CT ANGIOGRAPHY HEAD AND NECK TECHNIQUE: Multidetector CT imaging of the head and neck was performed using the standard protocol during bolus administration of intravenous contrast. Multiplanar CT image reconstructions and MIPs were obtained to evaluate the vascular anatomy. Carotid stenosis measurements (when applicable) are obtained utilizing NASCET criteria, using the distal internal carotid diameter as the denominator. CONTRAST:  80mL OMNIPAQUE IOHEXOL 350 MG/ML SOLN COMPARISON:  Noncontrast at CT from the same day. FINDINGS: CT HEAD Brain: The source images demonstrate no acute or subacute infarct. The basal ganglia are intact. The insular ribbon is within normal limits. No acute cortical infarct is present. No focal cerebellar lesions are evident. Calvarium and skull base: Within normal limits Paranasal sinuses: Clear Orbits: Unremarkable CTA NECK Aortic arch: A 3 vessel arch configuration is present. There is no focal stenosis or atherosclerotic calcification at the aorta. Right carotid system: The right common carotid artery is within normal limits. The bifurcation is unremarkable. The cervical right ICA is normal. Left carotid system: The left common carotid artery is mildly tortuous. There is no focal stenosis. Atherosclerotic calcifications are present at the left carotid bifurcation without a significant stenosis relative to the more distal vessel. The cervical left ICA is normal. Vertebral arteries:The vertebral arteries originate from the subclavian arteries bilaterally. The vertebral arteries appear to be codominant. There is no significant stenosis or focal vascular injury in the neck. The right PICA originates just below the dural margin. Skeleton: Bone windows demonstrate multilevel uncovertebral  spurring osseous foraminal narrowing is present on the left at C3-4 and C4-5 and on the right at C3-4 and C5-6. No focal lytic or blastic lesions are present. Other neck: No focal mucosal or submucosal lesions are present. The vocal cords are midline and symmetric. The tongue base is within normal limits. The salivary glands are intact. No significant adenopathy is present. The lung apices are clear. CTA HEAD Anterior circulation: The internal carotid arteries are within normal limits at the skullbase bilaterally. The ICA termini are unremarkable. The A1 and M1 segments are normal. The anterior communicating artery is patent. MCA bifurcations are intact bilaterally. The ACA and MCA branch vessels are within normal limits. Posterior circulation: The left PICA origin is visualized and normal. The vertebrobasilar junction is within normal limits. Posterior cerebral arteries contribute to the posterior cerebral arteries bilaterally. The PCA branch vessels are within normal limits bilaterally. Venous sinuses: The dural sinuses are patent. The straight sinus and deep cerebral veins are within normal limits bilaterally. Anatomic variants: None Delayed phase: The postcontrast images demonstrate no pathologic enhancement. IMPRESSION: 1. Minimal atherosclerotic changes at the left carotid bifurcation without significant stenosis. 2. No other focal atherosclerotic disease or stenosis. 3. Normal variant CTA circle of Willis without evidence for significant proximal stenosis, aneurysm, or branch vessel occlusion. 4. Mild degenerate changes in the cervical spine as described. Electronically Signed   By: Marin Roberts M.D.   On: 05/16/2015 13:02   Ct Head Wo Contrast  05/17/2015  CLINICAL DATA:  Left-sided weakness.  Subsequent encounter. EXAM: CT HEAD WITHOUT  CONTRAST TECHNIQUE: Contiguous axial images were obtained from the base of the skull through the vertex without intravenous contrast. COMPARISON:  05/16/2015  FINDINGS: There is no intracranial hemorrhage, mass or evidence of acute infarction. There is mild generalized atrophy. There is mild chronic microvascular ischemic change. There is no significant extra-axial fluid collection. No acute intracranial findings are evident. IMPRESSION: No interval change from the earlier study. Mild generalized atrophy and subtle chronic white matter hypodensity. No acute findings. Electronically Signed   By: Ellery Plunk M.D.   On: 05/17/2015 02:18   Ct Head Wo Contrast  05/16/2015  CLINICAL DATA:  Syncope, dizziness, chest and nausea. Left-sided numbness. EXAM: CT HEAD WITHOUT CONTRAST TECHNIQUE: Contiguous axial images were obtained from the base of the skull through the vertex without intravenous contrast. COMPARISON:  None. FINDINGS: No evidence of an acute infarct, acute hemorrhage, mass lesion, mass effect or hydrocephalus. No air-fluid levels in the visualized portions of the paranasal sinuses or mastoid air cells. No fracture. Brain was incidentally imaged post contrast administration for CT chest the same day. No areas of abnormal enhancement. IMPRESSION: No acute intracranial abnormality. Electronically Signed   By: Leanna Battles M.D.   On: 05/16/2015 12:23   Ct Angio Neck W/cm &/or Wo/cm  05/16/2015  CLINICAL DATA:  The patient awoke with vertigo at 4:15 a.m. Numbness in the left side of the face and arm. Anterior chest pain. EXAM: CT ANGIOGRAPHY HEAD AND NECK TECHNIQUE: Multidetector CT imaging of the head and neck was performed using the standard protocol during bolus administration of intravenous contrast. Multiplanar CT image reconstructions and MIPs were obtained to evaluate the vascular anatomy. Carotid stenosis measurements (when applicable) are obtained utilizing NASCET criteria, using the distal internal carotid diameter as the denominator. CONTRAST:  80mL OMNIPAQUE IOHEXOL 350 MG/ML SOLN COMPARISON:  Noncontrast at CT from the same day. FINDINGS: CT  HEAD Brain: The source images demonstrate no acute or subacute infarct. The basal ganglia are intact. The insular ribbon is within normal limits. No acute cortical infarct is present. No focal cerebellar lesions are evident. Calvarium and skull base: Within normal limits Paranasal sinuses: Clear Orbits: Unremarkable CTA NECK Aortic arch: A 3 vessel arch configuration is present. There is no focal stenosis or atherosclerotic calcification at the aorta. Right carotid system: The right common carotid artery is within normal limits. The bifurcation is unremarkable. The cervical right ICA is normal. Left carotid system: The left common carotid artery is mildly tortuous. There is no focal stenosis. Atherosclerotic calcifications are present at the left carotid bifurcation without a significant stenosis relative to the more distal vessel. The cervical left ICA is normal. Vertebral arteries:The vertebral arteries originate from the subclavian arteries bilaterally. The vertebral arteries appear to be codominant. There is no significant stenosis or focal vascular injury in the neck. The right PICA originates just below the dural margin. Skeleton: Bone windows demonstrate multilevel uncovertebral spurring osseous foraminal narrowing is present on the left at C3-4 and C4-5 and on the right at C3-4 and C5-6. No focal lytic or blastic lesions are present. Other neck: No focal mucosal or submucosal lesions are present. The vocal cords are midline and symmetric. The tongue base is within normal limits. The salivary glands are intact. No significant adenopathy is present. The lung apices are clear. CTA HEAD Anterior circulation: The internal carotid arteries are within normal limits at the skullbase bilaterally. The ICA termini are unremarkable. The A1 and M1 segments are normal. The anterior communicating artery is patent.  MCA bifurcations are intact bilaterally. The ACA and MCA branch vessels are within normal limits. Posterior  circulation: The left PICA origin is visualized and normal. The vertebrobasilar junction is within normal limits. Posterior cerebral arteries contribute to the posterior cerebral arteries bilaterally. The PCA branch vessels are within normal limits bilaterally. Venous sinuses: The dural sinuses are patent. The straight sinus and deep cerebral veins are within normal limits bilaterally. Anatomic variants: None Delayed phase: The postcontrast images demonstrate no pathologic enhancement. IMPRESSION: 1. Minimal atherosclerotic changes at the left carotid bifurcation without significant stenosis. 2. No other focal atherosclerotic disease or stenosis. 3. Normal variant CTA circle of Willis without evidence for significant proximal stenosis, aneurysm, or branch vessel occlusion. 4. Mild degenerate changes in the cervical spine as described. Electronically Signed   By: Marin Roberts M.D.   On: 05/16/2015 13:02    Medications:   Current facility-administered medications:  .  0.9 %  sodium chloride infusion, 250 mL, Intravenous, PRN, Tora Kindred York, PA-C .  0.9 %  sodium chloride infusion, , Intravenous, Continuous, Belkys A Regalado, MD, Last Rate: 75 mL/hr at 05/16/15 1804 .  acetaminophen (TYLENOL) tablet 650 mg, 650 mg, Oral, Q6H PRN **OR** acetaminophen (TYLENOL) suppository 650 mg, 650 mg, Rectal, Q6H PRN, Tora Kindred York, PA-C .  aspirin EC tablet 325 mg, 325 mg, Oral, Daily, Stephani Police, PA-C, 325 mg at 05/17/15 1036 .  baclofen (LIORESAL) tablet 5 mg, 5 mg, Oral, TID PRN, Stephani Police, PA-C .  bisacodyl (DULCOLAX) suppository 10 mg, 10 mg, Rectal, Daily PRN, Tora Kindred York, PA-C .  enoxaparin (LOVENOX) injection 40 mg, 40 mg, Subcutaneous, Q24H, Marianne L York, PA-C, 40 mg at 05/17/15 1756 .  famotidine (PEPCID) tablet 20 mg, 20 mg, Oral, BID, Opal Mckellips Daniel Nones, MD, 20 mg at 05/17/15 1037 .  HYDROcodone-acetaminophen (NORCO/VICODIN) 5-325 MG per tablet 1-2 tablet, 1-2 tablet,  Oral, Q4H PRN, Stephani Police, PA-C, 1 tablet at 05/17/15 1440 .  insulin aspart (novoLOG) injection 0-15 Units, 0-15 Units, Subcutaneous, TID WC, Catarina Hartshorn, MD, 5 Units at 05/17/15 1757 .  insulin aspart (novoLOG) injection 0-5 Units, 0-5 Units, Subcutaneous, QHS, Catarina Hartshorn, MD .  meclizine (ANTIVERT) tablet 25 mg, 25 mg, Oral, TID PRN, Tora Kindred York, PA-C .  nitroGLYCERIN (NITROSTAT) SL tablet 0.4 mg, 0.4 mg, Sublingual, Q5 min PRN, Belkys A Regalado, MD .  polyethylene glycol (MIRALAX / GLYCOLAX) packet 17 g, 17 g, Oral, Daily PRN, Stephani Police, PA-C .  regadenoson (LEXISCAN) injection SOLN 0.4 mg, 0.4 mg, Intravenous, Once, [employer], PA-C .  sodium chloride 0.9 % injection 3 mL, 3 mL, Intravenous, Q12H, Marianne L York, PA-C, 0 mL at 05/16/15 2215 .  sodium chloride 0.9 % injection 3 mL, 3 mL, Intravenous, Q12H, Tora Kindred York, PA-C, 3 mL at 05/16/15 2214 .  sodium chloride 0.9 % injection 3 mL, 3 mL, Intravenous, PRN, Stephani Police, PA-C .  valACYclovir (VALTREX) tablet 1,000 mg, 1,000 mg, Oral, TID, Stephani Police, PA-C, 1,000 mg at 05/17/15 1756 .  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 He is tolerating IV Solu-Medrol, Depakote, baclofen and valacyclovir without any significant side effects. He reports  that his neurologic symptoms have been grossly unchanged since admission. Recommend to continue the current treatment plan for another 24 hours.  We'll continue to follow up.

## 2015-05-17 NOTE — Progress Notes (Signed)
PROGRESS NOTE  Tora PerchesMark Woodbury ION:[id number]:[id number] DOB: 1960-08-03 DOA: 05/16/2015 PCP: No primary care provider on file.  Brief History 54 year old male with a history of vertebral fractures presented with vertigo, syncope and chest pain. For the past 2 weeks, the patient has been complaining of substernal and left-sided chest pain that lasts less than 1 minute that he describes as a "bee sting and knife like" sensation. He states that this can occur at anytime even at rest, although he has complained of some decreased exercise tolerance over the past few weeks. On 05/16/2015 4:30 AM, the patient workup to go to the bathroom at which time he had vertigo. 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 The patient woke up lying on his back. In addition, the patient has also been complaining of left facial numbness and lip numbness for approximately 1-2 days. He denies any focal general weakness, visual disturbance, dysarthria, or word finding difficulties..  Assessment/Plan: Vestibular neuritis  -The patient was seen by neurology who felt that the patient's dysesthesia are attributable to vestibular neuritis -CT brain and CTA Head and Neck--neg for infart; minimal ASVD L-carotid bifurcation -?Ramsey Hunt Syndrome -continue IV solumedrol x 24 hrs per neuro -continue valacyclovir per neuro -PT eval -05/17/15--repeat CT brain negative -UDS  Atypical Chest Pain -His description is quite atypical, and is reproducible on exam -However, the patient is requesting cardiology evaluation due to mature coronary disease in his family and decreased exercise tolerance in the past few weeks -Troponins negative 4 -EKG without any concerning ischemic changes -echo -continue ASA  Syncope -vasovagal by hx -orthostatic negative  Hyperglycemia -A1C -novolog sliding  scale -UA--check for proteinuria/glycosuria -partly from steroids  Tobacco abuse -tobacco cessation discussed  -uses a tin of "grizzly" every couple of days  Elevated blood pressure -will need close outpt followup  Family Communication:   Pt at beside Disposition Plan:   Home in 24 hours       Procedures/Studies: Ct Angio Head W/cm &/or Wo Cm  05/16/2015  CLINICAL DATA:  The patient awoke with vertigo at 4:15 a.m. Numbness in the left side of the face and arm. Anterior chest pain. EXAM: CT ANGIOGRAPHY HEAD AND NECK TECHNIQUE: Multidetector CT imaging of the head and neck was performed using the standard protocol during bolus administration of intravenous contrast. Multiplanar CT image reconstructions and MIPs were obtained to evaluate the vascular anatomy. Carotid stenosis measurements (when applicable) are obtained utilizing NASCET criteria, using the distal internal carotid diameter as the denominator. CONTRAST:  80mL OMNIPAQUE IOHEXOL 350 MG/ML SOLN COMPARISON:  Noncontrast at CT from the same day. FINDINGS: CT HEAD Brain: The source images demonstrate no acute or subacute infarct. The basal ganglia are intact. The insular ribbon is within normal limits. No acute cortical infarct is present. No focal cerebellar lesions are evident. Calvarium and skull base: Within normal limits Paranasal sinuses: Clear Orbits: Unremarkable CTA NECK Aortic arch: A 3 vessel arch configuration is present. There is no focal stenosis or atherosclerotic calcification at the aorta. Right carotid system: The right common carotid artery is within normal limits. The bifurcation is unremarkable. The cervical right ICA is normal. Left carotid system: The left common carotid artery is mildly tortuous. There is no focal stenosis. Atherosclerotic calcifications are present at the left carotid bifurcation without a significant stenosis relative to  the more distal vessel. The cervical left ICA is normal. Vertebral  arteries:The vertebral arteries originate from the subclavian arteries bilaterally. The vertebral arteries appear to be codominant. There is no significant stenosis or focal vascular injury in the neck. The right PICA originates just below the dural margin. Skeleton: Bone windows demonstrate multilevel uncovertebral spurring osseous foraminal narrowing is present on the left at C3-4 and C4-5 and on the right at C3-4 and C5-6. No focal lytic or blastic lesions are present. Other neck: No focal mucosal or submucosal lesions are present. The vocal cords are midline and symmetric. The tongue base is within normal limits. The salivary glands are intact. No significant adenopathy is present. The lung apices are clear. CTA HEAD Anterior circulation: The internal carotid arteries are within normal limits at the skullbase bilaterally. The ICA termini are unremarkable. The A1 and M1 segments are normal. The anterior communicating artery is patent. MCA bifurcations are intact bilaterally. The ACA and MCA branch vessels are within normal limits. Posterior circulation: The left PICA origin is visualized and normal. The vertebrobasilar junction is within normal limits. Posterior cerebral arteries contribute to the posterior cerebral arteries bilaterally. The PCA branch vessels are within normal limits bilaterally. Venous sinuses: The dural sinuses are patent. The straight sinus and deep cerebral veins are within normal limits bilaterally. Anatomic variants: None Delayed phase: The postcontrast images demonstrate no pathologic enhancement. IMPRESSION: 1. Minimal atherosclerotic changes at the left carotid bifurcation without significant stenosis. 2. No other focal atherosclerotic disease or stenosis. 3. Normal variant CTA circle of Willis without evidence for significant proximal stenosis, aneurysm, or branch vessel occlusion. 4. Mild degenerate changes in the cervical spine as described. Electronically Signed   By: Marin Roberts M.D.   On: 05/16/2015 13:02   Ct Head Wo Contrast  05/17/2015  CLINICAL DATA:  Left-sided weakness.  Subsequent encounter. EXAM: CT HEAD WITHOUT CONTRAST TECHNIQUE: Contiguous axial images were obtained from the base of the skull through the vertex without intravenous contrast. COMPARISON:  05/16/2015 FINDINGS: There is no intracranial hemorrhage, mass or evidence of acute infarction. There is mild generalized atrophy. There is mild chronic microvascular ischemic change. There is no significant extra-axial fluid collection. No acute intracranial findings are evident. IMPRESSION: No interval change from the earlier study. Mild generalized atrophy and subtle chronic white matter hypodensity. No acute findings. Electronically Signed   By: Ellery Plunk M.D.   On: 05/17/2015 02:18   Ct Head Wo Contrast  05/16/2015  CLINICAL DATA:  Syncope, dizziness, chest and nausea. Left-sided numbness. EXAM: CT HEAD WITHOUT CONTRAST TECHNIQUE: Contiguous axial images were obtained from the base of the skull through the vertex without intravenous contrast. COMPARISON:  None. FINDINGS: No evidence of an acute infarct, acute hemorrhage, mass lesion, mass effect or hydrocephalus. No air-fluid levels in the visualized portions of the paranasal sinuses or mastoid air cells. No fracture. Brain was incidentally imaged post contrast administration for CT chest the same day. No areas of abnormal enhancement. IMPRESSION: No acute intracranial abnormality. Electronically Signed   By: Leanna Battles M.D.   On: 05/16/2015 12:23   Ct Angio Neck W/cm &/or Wo/cm  05/16/2015  CLINICAL DATA:  The patient awoke with vertigo at 4:15 a.m. Numbness in the left side of the face and arm. Anterior chest pain. EXAM: CT ANGIOGRAPHY HEAD AND NECK TECHNIQUE: Multidetector CT imaging of the head and neck was performed using the standard protocol during bolus administration of intravenous contrast. Multiplanar CT image reconstructions and  MIPs  were obtained to evaluate the vascular anatomy. Carotid stenosis measurements (when applicable) are obtained utilizing NASCET criteria, using the distal internal carotid diameter as the denominator. CONTRAST:  80mL OMNIPAQUE IOHEXOL 350 MG/ML SOLN COMPARISON:  Noncontrast at CT from the same day. FINDINGS: CT HEAD Brain: The source images demonstrate no acute or subacute infarct. The basal ganglia are intact. The insular ribbon is within normal limits. No acute cortical infarct is present. No focal cerebellar lesions are evident. Calvarium and skull base: Within normal limits Paranasal sinuses: Clear Orbits: Unremarkable CTA NECK Aortic arch: A 3 vessel arch configuration is present. There is no focal stenosis or atherosclerotic calcification at the aorta. Right carotid system: The right common carotid artery is within normal limits. The bifurcation is unremarkable. The cervical right ICA is normal. Left carotid system: The left common carotid artery is mildly tortuous. There is no focal stenosis. Atherosclerotic calcifications are present at the left carotid bifurcation without a significant stenosis relative to the more distal vessel. The cervical left ICA is normal. Vertebral arteries:The vertebral arteries originate from the subclavian arteries bilaterally. The vertebral arteries appear to be codominant. There is no significant stenosis or focal vascular injury in the neck. The right PICA originates just below the dural margin. Skeleton: Bone windows demonstrate multilevel uncovertebral spurring osseous foraminal narrowing is present on the left at C3-4 and C4-5 and on the right at C3-4 and C5-6. No focal lytic or blastic lesions are present. Other neck: No focal mucosal or submucosal lesions are present. The vocal cords are midline and symmetric. The tongue base is within normal limits. The salivary glands are intact. No significant adenopathy is present. The lung apices are clear. CTA HEAD Anterior  circulation: The internal carotid arteries are within normal limits at the skullbase bilaterally. The ICA termini are unremarkable. The A1 and M1 segments are normal. The anterior communicating artery is patent. MCA bifurcations are intact bilaterally. The ACA and MCA branch vessels are within normal limits. Posterior circulation: The left PICA origin is visualized and normal. The vertebrobasilar junction is within normal limits. Posterior cerebral arteries contribute to the posterior cerebral arteries bilaterally. The PCA branch vessels are within normal limits bilaterally. Venous sinuses: The dural sinuses are patent. The straight sinus and deep cerebral veins are within normal limits bilaterally. Anatomic variants: None Delayed phase: The postcontrast images demonstrate no pathologic enhancement. IMPRESSION: 1. Minimal atherosclerotic changes at the left carotid bifurcation without significant stenosis. 2. No other focal atherosclerotic disease or stenosis. 3. Normal variant CTA circle of Willis without evidence for significant proximal stenosis, aneurysm, or branch vessel occlusion. 4. Mild degenerate changes in the cervical spine as described. Electronically Signed   By: Marin Roberts M.D.   On: 05/16/2015 13:02         Subjective:  patient continues to have intermittent chest discomfort. Still having some dizziness;  denies any fevers, chills, short of breath, nausea, vomiting, diarrhea, abdominal pain. No dysuria or hematuria. No rashes. Denies any hematochezia or melena.  Objective: Filed Vitals:   05/16/15 1430 05/16/15 1644 05/16/15 2120 05/17/15 0502  BP: 131/88 145/82 134/84 145/80  Pulse: 78 75 71 76  Temp:  97.6 F (36.4 C) 98.2 F (36.8 C) 97.8 F (36.6 C)  TempSrc:  Oral Oral Oral  Resp: Height:      Weight:      SpO2: 97% 91% 98% 97%    Intake/Output Summary (Last 24 hours) at 05/17/15 1610 Last data filed at  05/17/15 0907  Gross per 24 hour  Intake    1484 ml  Output    350 ml  Net   1134 ml   Weight change:  Exam:   General:  Pt is alert, follows commands appropriately, not in acute distress  HEENT: No icterus, No thrush, No neck mass, [place]/AT  Cardiovascular: RRR, S1/S2, no rubs, no gallops  Respiratory: CTA bilaterally, no wheezing, no crackles, no rhonchi  Abdomen: Soft/+BS, non tender, non distended, no guarding; no hepatosplenomegaly   Extremities: No edema, No lymphangitis, No petechiae, No rashes, no synovitis  Data Reviewed: Basic Metabolic Panel:  Recent Labs Lab 05/16/15 1117 05/17/15 0511  NA 139 138  K 3.9 4.3  CL 108 107  CO2 25 23  GLUCOSE 119* 214*  BUN 14 17  CREATININE 0.92 1.04  CALCIUM 9.1 9.2   Liver Function Tests:  Recent Labs Lab 05/16/15 1430  AST 25  ALT 34  ALKPHOS 74  BILITOT 0.7  PROT 6.5  ALBUMIN 3.4*   No results for input(s): LIPASE, AMYLASE in the last 168 hours. No results for input(s): AMMONIA in the last 168 hours. CBC:  Recent Labs Lab 05/16/15 1117  WBC 10.1  NEUTROABS 6.3  HGB 15.0  HCT 45.6  MCV 91.8  PLT 238   Cardiac Enzymes:  Recent Labs Lab 05/16/15 1117 05/16/15 1430 05/16/15 1730 05/16/15 2220 05/17/15 0511  TROPONINI <0.03 <0.03 <0.03 <0.03 <0.03   BNP: Invalid input(s): POCBNP CBG:  Recent Labs Lab 05/16/15 1031 05/16/15 1802 05/16/15 2118 05/17/15 0744  GLUCAP 125* 137* 180* 219*    No results found for this or any previous visit (from the past 240 hour(s)).   Scheduled Meds: . aspirin EC  325 mg Oral Daily  . enoxaparin (LOVENOX) injection  40 mg Subcutaneous Q24H  . famotidine  20 mg Oral BID  . insulin aspart  0-9 Units Subcutaneous TID WC  . methylPREDNISolone (SOLU-MEDROL) injection  250 mg Intravenous Q6H  . sodium chloride  3 mL Intravenous Q12H  . sodium chloride  3 mL Intravenous Q12H  . valACYclovir  1,000 mg Oral TID   Continuous Infusions: . sodium chloride 75 mL/hr at 05/16/15 1804     Kelcy Baeten,  DO  [facility] Pager [phone]  If 7PM-7AM, please contact night-coverage [email or web address] Password [email or web address] 05/17/2015, 9:09 AM   LOS: 1 day
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
## 2015-05-17 NOTE — Progress Notes (Signed)
*  PRELIMINARY RESULTS* Echocardiogram 2D Echocardiogram has been performed.  Dennis Gonzales, Dennis Gonzales 05/17/2015, 2:22 PM

## 2015-05-17 NOTE — Care Management Note (Signed)
Case Management Note  Patient Details  Name: Dennis PerchesMark Gonzales MRN: [id number] Date of Birth: 08-Oct-1960  Subjective/Objective:    vertigo, syncope and chest pain                Action/Plan:  NCM spoke to pt and he has PCP, Dr. Alain Honeyichard Herber # [phone]. Pt states he is able to pay for his medication. No NCM needs identified.    Expected Discharge Date:  05/17/2015               Expected Discharge Plan:  Home/Self Care  In-House Referral:     Discharge planning Services  CM Consult  Status of Service:  Completed, signed off  Medicare Important Message Given:    Date Medicare IM Given:    Medicare IM give by:    Date Additional Medicare IM Given:    Additional Medicare Important Message give by:     If discussed at Long Length of Stay Meetings, dates discussed:    Additional Comments:  Elliot CousinShavis, Sina Sumpter Ellen, RN 05/17/2015, 12:41 PM

## 2015-05-17 NOTE — Progress Notes (Addendum)
Inpatient Diabetes Program Recommendations  AACE/ADA: New Consensus Statement on Inpatient Glycemic Control (2015)  Target Ranges:  Prepandial:   less than 140 mg/dL      Peak postprandial:   less than 180 mg/dL (1-2 hours)      Critically ill patients:  140 - 180 mg/dL   Review of Glycemic Control:  Results for Dennis Gonzales, Dennis Gonzales (MRN [id number]) as of 05/17/2015 10:55  Ref. Range 05/16/2015 10:31 05/16/2015 18:02 05/16/2015 21:18 05/17/2015 07:44  Glucose-Capillary Latest Ref Range: 65-99 mg/dL 409125 (H) 811137 (H) 914180 (H) 219 (H)  Results for Dennis Gonzales, Dennis Gonzales (MRN [id number]) as of 05/17/2015 10:55  Ref. Range 05/16/2015 14:29  Hemoglobin A1C Latest Ref Range: 4.8-5.6 % 6.7 (H)   Diabetes history: No history of diabetes.  Note that A1C>6.5%.   Inpatient Diabetes Program Recommendations:    Note patient is on high dose steroids.  Please consider increasing Novolog correction to q 4 hours while patient is on steroids.    Thanks, Beryl MeagerJenny Nahum Sherrer, RN, BC-ADM Inpatient Diabetes Coordinator Pager [phone] (8a-5p

## 2015-05-18 ENCOUNTER — Inpatient Hospital Stay (HOSPITAL_BASED_OUTPATIENT_CLINIC_OR_DEPARTMENT_OTHER): Payer: Medicare Other

## 2015-05-18 ENCOUNTER — Other Ambulatory Visit (HOSPITAL_COMMUNITY): Payer: Medicare Other

## 2015-05-18 DIAGNOSIS — R55 Syncope and collapse: Secondary | ICD-10-CM | POA: Diagnosis not present

## 2015-05-18 DIAGNOSIS — R079 Chest pain, unspecified: Secondary | ICD-10-CM | POA: Diagnosis not present

## 2015-05-18 DIAGNOSIS — Z720 Tobacco use: Secondary | ICD-10-CM

## 2015-05-18 DIAGNOSIS — H8122 Vestibular neuronitis, left ear: Secondary | ICD-10-CM

## 2015-05-18 DIAGNOSIS — I493 Ventricular premature depolarization: Secondary | ICD-10-CM | POA: Diagnosis not present

## 2015-05-18 DIAGNOSIS — M6289 Other specified disorders of muscle: Secondary | ICD-10-CM

## 2015-05-18 DIAGNOSIS — R42 Dizziness and giddiness: Secondary | ICD-10-CM | POA: Diagnosis not present

## 2015-05-18 DIAGNOSIS — I2510 Atherosclerotic heart disease of native coronary artery without angina pectoris: Secondary | ICD-10-CM | POA: Diagnosis present

## 2015-05-18 DIAGNOSIS — R072 Precordial pain: Secondary | ICD-10-CM | POA: Diagnosis not present

## 2015-05-18 LAB — NM MYOCAR MULTI W/SPECT W/WALL MOTION / EF
CHL CUP MPHR: 168 {beats}/min
CHL CUP NUCLEAR SDS: 1
CHL CUP NUCLEAR SSS: 6
CSEPEW: 1 METS
CSEPHR: 66 %
CSEPPHR: 111 {beats}/min
Exercise duration (min): 0 min
Exercise duration (sec): 0 s
LHR: 0.32
LVDIAVOL: 97 mL
LVSYSVOL: 35 mL
Rest HR: 66 {beats}/min
SRS: 5
TID: 1.47

## 2015-05-18 LAB — GLUCOSE, CAPILLARY
GLUCOSE-CAPILLARY: 211 mg/dL — AB (ref 65–99)
Glucose-Capillary: 165 mg/dL — ABNORMAL HIGH (ref 65–99)
Glucose-Capillary: 184 mg/dL — ABNORMAL HIGH (ref 65–99)
Glucose-Capillary: 196 mg/dL — ABNORMAL HIGH (ref 65–99)

## 2015-05-18 LAB — HEMOGLOBIN A1C
HEMOGLOBIN A1C: 6.5 % — AB (ref 4.8–5.6)
Mean Plasma Glucose: 140 mg/dL

## 2015-05-18 MED ORDER — DIVALPROEX SODIUM 250 MG PO DR TAB
250.0000 mg | DELAYED_RELEASE_TABLET | Freq: Two times a day (BID) | ORAL | Status: DC
  Administered 2015-05-18 – 2015-05-19 (×3): 250 mg via ORAL
  Filled 2015-05-18 (×6): qty 1

## 2015-05-18 MED ORDER — TECHNETIUM TC 99M SESTAMIBI - CARDIOLITE
30.0000 | Freq: Once | INTRAVENOUS | Status: AC | PRN
Start: 2015-05-18 — End: 2015-05-18
  Administered 2015-05-18: 30 via INTRAVENOUS

## 2015-05-18 MED ORDER — PREDNISONE 20 MG PO TABS: 20.0000 mg | ORAL_TABLET | Freq: Every day | ORAL | Status: DC

## 2015-05-18 MED ORDER — PREDNISONE 50 MG PO TABS
60.0000 mg | ORAL_TABLET | Freq: Every day | ORAL | Status: AC
  Administered 2015-05-18: 60 mg via ORAL
  Filled 2015-05-18: qty 1

## 2015-05-18 MED ORDER — REGADENOSON 0.4 MG/5ML IV SOLN
INTRAVENOUS | Status: AC
  Filled 2015-05-18: qty 5

## 2015-05-18 MED ORDER — PREDNISONE 20 MG PO TABS: 40.0000 mg | ORAL_TABLET | Freq: Every day | ORAL | Status: DC

## 2015-05-18 MED ORDER — PREDNISONE 20 MG PO TABS: 30.0000 mg | ORAL_TABLET | Freq: Every day | ORAL | Status: DC

## 2015-05-18 MED ORDER — PREDNISONE 50 MG PO TABS
50.0000 mg | ORAL_TABLET | Freq: Every day | ORAL | Status: AC
  Administered 2015-05-19: 50 mg via ORAL
  Filled 2015-05-18: qty 1

## 2015-05-18 MED ORDER — PREDNISONE 10 MG PO TABS: 10.0000 mg | ORAL_TABLET | Freq: Every day | ORAL | Status: DC

## 2015-05-18 MED ORDER — TECHNETIUM TC 99M SESTAMIBI GENERIC - CARDIOLITE
10.0000 | Freq: Once | INTRAVENOUS | Status: AC | PRN
Start: 2015-05-18 — End: 2015-05-18
  Administered 2015-05-18: 10 via INTRAVENOUS

## 2015-05-18 MED ORDER — MECLIZINE HCL 25 MG PO TABS
25.0000 mg | ORAL_TABLET | Freq: Two times a day (BID) | ORAL | Status: DC
  Administered 2015-05-18 – 2015-05-19 (×2): 25 mg via ORAL
  Filled 2015-05-18 (×3): qty 1
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
## 2015-05-18 NOTE — Patient Instructions (Signed)
Gaze Stabilization: Sitting    Keeping eyes on target on wall 5-10 feet away, tilt head down 15-30 and move head side to side for _30-60___ seconds. Repeat while moving head up and down for 30-60____ seconds. Do _5___ sessions per day.   Copyright  VHI. All rights reserved.  Gaze Stabilization: Tip Card  1.Target must remain in focus, not blurry, and appear stationary while head is in motion. 2.Perform exercises with small head movements (45 to either side of midline). 3.Increase speed of head motion so long as target is in focus. 4.If you wear eyeglasses, be sure you can see target through lens (therapist will give specific instructions for bifocal / progressive lenses). 5.These exercises may provoke dizziness or nausea. Work through these symptoms. If too dizzy, slow head movement slightly. Rest between each exercise. 6.Exercises demand concentration; avoid distractions. 7.For safety, perform standing exercises close to a counter, wall, corner, or next to someone.  Copyright  VHI. All rights reserved.

## 2015-05-18 NOTE — Consult Note (Signed)
Arn MedalSoja Monquie Fulgham EdD

## 2015-05-18 NOTE — Progress Notes (Signed)
  1-day Nuclear Stress Test complete. [facility]  Signed, Ellsworth LennoxBrittany M Forest Pruden, PA-C 05/18/2015, 10:19 AM Pager: [phone]
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
## 2015-05-18 NOTE — Care Management Note (Signed)
Case Management Note  Patient Details  Name: Dennis PerchesMark Gonzales MRN: [id number] Date of Birth: 1960/11/25  Subjective/Objective:                 Indepemdent patent from home, admitted with CP, syncope, L sided weakness.    Action/Plan:  Patient will discharge to home, self care. Referral made to [facility]. Dagoberto ReefClerk stated that the patient would not be able to be seen for two weeks. Patient offered other choices due to wait but stated that he wanted to go to [facility] rehab. CM faxed demographics and Rx for vestibular therapy to [phone]. Expected Discharge Date:                  Expected Discharge Plan:  Home/Self Care  In-House Referral:     Discharge planning Services  CM Consult  Post Acute Care Choice:    Choice offered to:     DME Arranged:    DME Agency:     HH Arranged:    HH Agency:     Status of Service:  Completed, signed off  Medicare Important Message Given:    Date Medicare IM Given:    Medicare IM give by:    Date Additional Medicare IM Given:    Additional Medicare Important Message give by:     If discussed at Long Length of Stay Meetings, dates discussed:    Additional Comments:  Lawerance SabalDebbie Courney Garrod, RN 05/18/2015, 3:27 PM

## 2015-05-19 DIAGNOSIS — R55 Syncope and collapse: Secondary | ICD-10-CM | POA: Diagnosis not present

## 2015-05-19 DIAGNOSIS — Z720 Tobacco use: Secondary | ICD-10-CM | POA: Diagnosis not present

## 2015-05-19 DIAGNOSIS — R531 Weakness: Secondary | ICD-10-CM | POA: Insufficient documentation

## 2015-05-19 DIAGNOSIS — E119 Type 2 diabetes mellitus without complications: Secondary | ICD-10-CM | POA: Insufficient documentation

## 2015-05-19 DIAGNOSIS — M6289 Other specified disorders of muscle: Secondary | ICD-10-CM | POA: Diagnosis not present

## 2015-05-19 DIAGNOSIS — R739 Hyperglycemia, unspecified: Secondary | ICD-10-CM

## 2015-05-19 DIAGNOSIS — R079 Chest pain, unspecified: Secondary | ICD-10-CM | POA: Diagnosis not present

## 2015-05-19 DIAGNOSIS — K219 Gastro-esophageal reflux disease without esophagitis: Secondary | ICD-10-CM

## 2015-05-19 LAB — GLUCOSE, CAPILLARY: GLUCOSE-CAPILLARY: 113 mg/dL — AB (ref 65–99)

## 2015-05-19 MED ORDER — PREDNISONE 20 MG PO TABS: ORAL_TABLET | ORAL | Status: AC

## 2015-05-19 MED ORDER — FAMOTIDINE 20 MG PO TABS: 20.0000 mg | ORAL_TABLET | Freq: Every day | ORAL | Status: AC

## 2015-05-19 MED ORDER — VALACYCLOVIR HCL 1 G PO TABS: 1000.0000 mg | ORAL_TABLET | Freq: Three times a day (TID) | ORAL | Status: AC

## 2015-05-19 MED ORDER — BACLOFEN 10 MG PO TABS: 5.0000 mg | ORAL_TABLET | Freq: Three times a day (TID) | ORAL | Status: AC | PRN

## 2015-05-19 MED ORDER — DIVALPROEX SODIUM 250 MG PO DR TAB: 250.0000 mg | DELAYED_RELEASE_TABLET | Freq: Two times a day (BID) | ORAL | Status: AC

## 2015-05-19 MED ORDER — MECLIZINE HCL 25 MG PO TABS: 25.0000 mg | ORAL_TABLET | Freq: Two times a day (BID) | ORAL | Status: AC

## 2015-05-19 MED ORDER — PANTOPRAZOLE SODIUM 40 MG PO TBEC: 40.0000 mg | DELAYED_RELEASE_TABLET | Freq: Every day | ORAL | Status: AC

## 2015-05-19 MED ORDER — HYDROCODONE-ACETAMINOPHEN 5-325 MG PO TABS: 1.0000 | ORAL_TABLET | Freq: Four times a day (QID) | ORAL | Status: AC | PRN

## 2015-05-19 NOTE — Discharge Summary (Signed)
Physician Discharge Summary  Dennis Gonzales ZOX:[id number] DOB: [birth year] DOA: 05/16/2015  PCP: Alain Honey, MD Admit date: 05/16/2015 Discharge date: 05/19/2015  Time spent: 35 minutes  Recommendations for Outpatient Follow-up:  Please follow patient blood pressure and initiate antihypertensive regimen if still elevated Please follow closely patient's CBGs an A1c in order to initiate hypoglycemic regimen if continue to be elevated Patient will require close follow-up and further referral to neurology if his symptoms fail to completely resolve Repeat basic metabolic panel to follow electrolytes and renal function   Discharge Diagnoses:  Principal Problem:   Syncope and collapse Active Problems:   Vertigo   Tobacco abuse   Acute left-sided weakness   Stroke [facility])   Vestibular neuritis   Precordial chest pain   Hyperglycemia   CAD- minor CAD at cath Feb 2014 [facility])   PVC's (premature ventricular contractions)   Pain in the chest   Discharge Condition:  Stable and improved. No orthostatic abnormalities and no chest pain at discharge he did patient will follow up with PCP and with cardiology as an outpatient.  Diet recommendation:  Heart healthy diet and low carbohydrates  Filed Weights   05/16/15 1029  Weight: 111.131 kg (245 lb)    History of present illness:  54 year old male with a history of vertebral fractures presented with vertigo, syncope and chest pain. For the past 2 weeks, the patient has been complaining of substernal and left-sided chest pain that lasts less than 1 minute that he describes as a "bee sting and knife like" sensation. He states that this can occur at anytime even at rest, although he has complained of some decreased exercise tolerance over the past few weeks. On 05/16/2015 4:30 AM, the patient workup to go to the bathroom at which time he had vertigo. 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 The patient woke up lying on his back. In addition, the patient has also been complaining of left facial numbness and lip numbness for approximately 1-2 days. He denies any focal general weakness, visual disturbance, dysarthria, or word finding difficulties.Marland Kitchen   Hospital Course:  Vestibular neuritis  -The patient was seen by neurology who felt that the patient's dysesthesia are attributable to vestibular neuritis -CT brain and CTA Head and Neck--neg for infart; minimal ASVD L-carotid bifurcation -?Ramsey Hunt Syndrome -per neurology ok to transition to PO prednisone and valtrex -outpatient vestibular training and started on meclizine BID -05/17/15--repeat CT brain negative  Atypical Chest Pain -His description is quite atypical, and is reproducible on exam -However, the patient is requesting cardiology evaluation due to mature coronary disease in his family and decreased exercise tolerance in the past few weeks -Troponins negative 4 -EKG without any concerning ischemic changes -echo w/o WMA;  Preserved ejection fraction -continue ASA -ask to quit tobacco - nuclear stress test with low risk probability and no signs of acute ischemic process. - Will follow with cardiology as an outpatient  Syncope -vasovagal by hx -orthostatic negative at discharge -vestibular abnormality/vertigo contributing -will need outpatient vestibular training and cardiology will follow him up for monitoring event if further episodes recurred -discharge on meclizine and with referral for vestibular training   Hyperglycemia/ newly diagnosed type 2 diabetes -A1C 6.5 -steroids worsening CBG's -will allow 3 months lifestyle modifications changes as requested by patient, he will have close follow up with PCP to initiate hypoglycemic regimen if CBG's/A1C  remains elevated. Patient understand and will follow rec's -advise to  follow low carb diet   Tobacco abuse -tobacco cessation discussed  -uses a tin of "grizzly" every couple of days  Elevated blood pressure -no prior hx of HTN -steroids and pain contributing to elevated blood pressure readings -will need close outpt follow up and if BP remains elevated will need initiation of antihypertensive regimen  -advise to follow low sodium diet  Procedures: - 2-D echo:  No oral motion abnormalities. Preserved ejection fraction and grade 1 diastolic dysfunction. - nuclear stress test:  Low risk probability  - see below for x-ray reports  Consultations: Nneurology Cardiology service  Discharge Exam: Filed Vitals:   05/18/15 2157 05/19/15 0545  BP: 140/80 137/83  Pulse: 57 54  Temp: 97.7 F (36.5 C) 97.6 F (36.4 C)  Resp: 18 18    General: Pt is alert, follows commands appropriately, no complaining of CP; no nausea, no vomiting and reports that his dizziness is somewhat better. Still with complaints of left arm nummbness  HEENT: No icterus, No thrush, No neck mass, /AT  Cardiovascular: RRR, S1/S2, no rubs, no gallops  Respiratory: CTA bilaterally, no wheezing, no crackles, no rhonchi  Abdomen: Soft/+BS, non tender, non distended, no guarding; no hepatosplenomegaly   Extremities: No edema, No lymphangitis, No petechiae, No rashes, no synovitis  Discharge Instructions   Discharge Instructions    Diet - low sodium heart healthy    Complete by:  As directed      Discharge instructions    Complete by:  As directed   Take medications as prescribed Please keep yourself well hydrated Follow a heart healthy and low carbohydrates diet Arrange follow up with PCP in 10 days Follow instructions and recommendations for outpatient vestibular training          Current Discharge Medication List    START taking these medications   Details  baclofen (LIORESAL) 10 MG tablet Take 0.5 tablets (5 mg total) by mouth 3 (three) times daily as needed for  muscle spasms (neck pain). Qty: 20 each, Refills: 0    divalproex (DEPAKOTE) 250 MG DR tablet Take 1 tablet (250 mg total) by mouth every 12 (twelve) hours. Qty: 10 tablet, Refills: 0    famotidine (PEPCID) 20 MG tablet Take 1 tablet (20 mg total) by mouth at bedtime. Qty: 30 tablet, Refills: 1    HYDROcodone-acetaminophen (NORCO/VICODIN) 5-325 MG tablet Take 1 tablet by mouth every 6 (six) hours as needed for severe pain. Qty: 30 tablet, Refills: 0    meclizine (ANTIVERT) 25 MG tablet Take 1 tablet (25 mg total) by mouth 2 (two) times daily. Qty: 60 tablet, Refills: 0    pantoprazole (PROTONIX) 40 MG tablet Take 1 tablet (40 mg total) by mouth daily. Qty: 30 tablet, Refills: 0    predniSONE (DELTASONE) 20 MG tablet Take 2 tablets by mouth daily X 1 day; then 1.5 tablet daily X 1 day; then 1 tablet by mouth daily X 2 days; then 0.5 tablets by mouth daily X 2 days and stop prednisone Qty: 7 tablet, Refills: 0    valACYclovir (VALTREX) 1000 MG tablet Take 1 tablet (1,000 mg total) by mouth 3 (three) times daily. Qty: 12 tablet, Refills: 0       No Known Allergies Follow-up Information    Follow up with [facility].   Specialty:  Pediatric Rehabilitation   Why:  Referral FAXED 05-18-15. Office will call to schedule appointment.   Contact  information:   [address] [place] [address] [phone]        The results of significant diagnostics from this hospitalization (including imaging, microbiology, ancillary and laboratory) are listed below for reference.    Significant Diagnostic Studies: Ct Angio Head W/cm &/or Wo Cm  05/16/2015  CLINICAL DATA:  The patient awoke with vertigo at 4:15 a.m. Numbness in the left side of the face and arm. Anterior chest pain. EXAM: CT ANGIOGRAPHY HEAD AND NECK TECHNIQUE: Multidetector CT imaging of the head and neck was performed using the standard protocol during bolus administration of intravenous contrast.  Multiplanar CT image reconstructions and MIPs were obtained to evaluate the vascular anatomy. Carotid stenosis measurements (when applicable) are obtained utilizing NASCET criteria, using the distal internal carotid diameter as the denominator. CONTRAST:  80mL OMNIPAQUE IOHEXOL 350 MG/ML SOLN COMPARISON:  Noncontrast at CT from the same day. FINDINGS: CT HEAD Brain: The source images demonstrate no acute or subacute infarct. The basal ganglia are intact. The insular ribbon is within normal limits. No acute cortical infarct is present. No focal cerebellar lesions are evident. Calvarium and skull base: Within normal limits Paranasal sinuses: Clear Orbits: Unremarkable CTA NECK Aortic arch: A 3 vessel arch configuration is present. There is no focal stenosis or atherosclerotic calcification at the aorta. Right carotid system: The right common carotid artery is within normal limits. The bifurcation is unremarkable. The cervical right ICA is normal. Left carotid system: The left common carotid artery is mildly tortuous. There is no focal stenosis. Atherosclerotic calcifications are present at the left carotid bifurcation without a significant stenosis relative to the more distal vessel. The cervical left ICA is normal. Vertebral arteries:The vertebral arteries originate from the subclavian arteries bilaterally. The vertebral arteries appear to be codominant. There is no significant stenosis or focal vascular injury in the neck. The right PICA originates just below the dural margin. Skeleton: Bone windows demonstrate multilevel uncovertebral spurring osseous foraminal narrowing is present on the left at C3-4 and C4-5 and on the right at C3-4 and C5-6. No focal lytic or blastic lesions are present. Other neck: No focal mucosal or submucosal lesions are present. The vocal cords are midline and symmetric. The tongue base is within normal limits. The salivary glands are intact. No significant adenopathy is present. The lung  apices are clear. CTA HEAD Anterior circulation: The internal carotid arteries are within normal limits at the skullbase bilaterally. The ICA termini are unremarkable. The A1 and M1 segments are normal. The anterior communicating artery is patent. MCA bifurcations are intact bilaterally. The ACA and MCA branch vessels are within normal limits. Posterior circulation: The left PICA origin is visualized and normal. The vertebrobasilar junction is within normal limits. Posterior cerebral arteries contribute to the posterior cerebral arteries bilaterally. The PCA branch vessels are within normal limits bilaterally. Venous sinuses: The dural sinuses are patent. The straight sinus and deep cerebral veins are within normal limits bilaterally. Anatomic variants: None Delayed phase: The postcontrast images demonstrate no pathologic enhancement. IMPRESSION: 1. Minimal atherosclerotic changes at the left carotid bifurcation without significant stenosis. 2. No other focal atherosclerotic disease or stenosis. 3. Normal variant CTA circle of Willis without evidence for significant proximal stenosis, aneurysm, or branch vessel occlusion. 4. Mild degenerate changes in the cervical spine as described. Electronically Signed   By: Marin Roberts M.D.   On: 05/16/2015 13:02   Ct Head Wo Contrast  05/17/2015  CLINICAL DATA:  Left-sided weakness.  Subsequent encounter. EXAM: CT  HEAD WITHOUT CONTRAST TECHNIQUE: Contiguous axial images were obtained from the base of the skull through the vertex without intravenous contrast. COMPARISON:  05/16/2015 FINDINGS: There is no intracranial hemorrhage, mass or evidence of acute infarction. There is mild generalized atrophy. There is mild chronic microvascular ischemic change. There is no significant extra-axial fluid collection. No acute intracranial findings are evident. IMPRESSION: No interval change from the earlier study. Mild generalized atrophy and subtle chronic white matter  hypodensity. No acute findings. Electronically Signed   By: Ellery Plunkaniel R Mitchell M.D.   On: 05/17/2015 02:18   Ct Head Wo Contrast  05/16/2015  CLINICAL DATA:  Syncope, dizziness, chest and nausea. Left-sided numbness. EXAM: CT HEAD WITHOUT CONTRAST TECHNIQUE: Contiguous axial images were obtained from the base of the skull through the vertex without intravenous contrast. COMPARISON:  None. FINDINGS: No evidence of an acute infarct, acute hemorrhage, mass lesion, mass effect or hydrocephalus. No air-fluid levels in the visualized portions of the paranasal sinuses or mastoid air cells. No fracture. Brain was incidentally imaged post contrast administration for CT chest the same day. No areas of abnormal enhancement. IMPRESSION: No acute intracranial abnormality. Electronically Signed   By: Leanna BattlesMelinda  Blietz M.D.   On: 05/16/2015 12:23   Ct Angio Neck W/cm &/or Wo/cm  05/16/2015  CLINICAL DATA:  The patient awoke with vertigo at 4:15 a.m. Numbness in the left side of the face and arm. Anterior chest pain. EXAM: CT ANGIOGRAPHY HEAD AND NECK TECHNIQUE: Multidetector CT imaging of the head and neck was performed using the standard protocol during bolus administration of intravenous contrast. Multiplanar CT image reconstructions and MIPs were obtained to evaluate the vascular anatomy. Carotid stenosis measurements (when applicable) are obtained utilizing NASCET criteria, using the distal internal carotid diameter as the denominator. CONTRAST:  80mL OMNIPAQUE IOHEXOL 350 MG/ML SOLN COMPARISON:  Noncontrast at CT from the same day. FINDINGS: CT HEAD Brain: The source images demonstrate no acute or subacute infarct. The basal ganglia are intact. The insular ribbon is within normal limits. No acute cortical infarct is present. No focal cerebellar lesions are evident. Calvarium and skull base: Within normal limits Paranasal sinuses: Clear Orbits: Unremarkable CTA NECK Aortic arch: A 3 vessel arch configuration is present.  There is no focal stenosis or atherosclerotic calcification at the aorta. Right carotid system: The right common carotid artery is within normal limits. The bifurcation is unremarkable. The cervical right ICA is normal. Left carotid system: The left common carotid artery is mildly tortuous. There is no focal stenosis. Atherosclerotic calcifications are present at the left carotid bifurcation without a significant stenosis relative to the more distal vessel. The cervical left ICA is normal. Vertebral arteries:The vertebral arteries originate from the subclavian arteries bilaterally. The vertebral arteries appear to be codominant. There is no significant stenosis or focal vascular injury in the neck. The right PICA originates just below the dural margin. Skeleton: Bone windows demonstrate multilevel uncovertebral spurring osseous foraminal narrowing is present on the left at C3-4 and C4-5 and on the right at C3-4 and C5-6. No focal lytic or blastic lesions are present. Other neck: No focal mucosal or submucosal lesions are present. The vocal cords are midline and symmetric. The tongue base is within normal limits. The salivary glands are intact. No significant adenopathy is present. The lung apices are clear. CTA HEAD Anterior circulation: The internal carotid arteries are within normal limits at the skullbase bilaterally. The ICA termini are unremarkable. The A1 and M1 segments are normal. The anterior communicating artery  is patent. MCA bifurcations are intact bilaterally. The ACA and MCA branch vessels are within normal limits. Posterior circulation: The left PICA origin is visualized and normal. The vertebrobasilar junction is within normal limits. Posterior cerebral arteries contribute to the posterior cerebral arteries bilaterally. The PCA branch vessels are within normal limits bilaterally. Venous sinuses: The dural sinuses are patent. The straight sinus and deep cerebral veins are within normal limits  bilaterally. Anatomic variants: None Delayed phase: The postcontrast images demonstrate no pathologic enhancement. IMPRESSION: 1. Minimal atherosclerotic changes at the left carotid bifurcation without significant stenosis. 2. No other focal atherosclerotic disease or stenosis. 3. Normal variant CTA circle of Willis without evidence for significant proximal stenosis, aneurysm, or branch vessel occlusion. 4. Mild degenerate changes in the cervical spine as described. Electronically Signed   By: Marin Roberts M.D.   On: 05/16/2015 13:02   Nm Myocar Multi W/spect W/wall Motion / Ef  05/18/2015   There was no ST segment deviation noted during stress.  The study is normal.  The left ventricular ejection fraction is normal (55-65%).  This is a low risk study.  Normal resting and stress perfusion no ischemia or infarct EF 63%   Labs: Basic Metabolic Panel:  Recent Labs Lab 05/16/15 1117 05/17/15 0511  NA 139 138  K 3.9 4.3  CL 108 107  CO2 25 23  GLUCOSE 119* 214*  BUN 14 17  CREATININE 0.92 1.04  CALCIUM 9.1 9.2   Liver Function Tests:  Recent Labs Lab 05/16/15 1430  AST 25  ALT 34  ALKPHOS 74  BILITOT 0.7  PROT 6.5  ALBUMIN 3.4*   CBC:  Recent Labs Lab 05/16/15 1117  WBC 10.1  NEUTROABS 6.3  HGB 15.0  HCT 45.6  MCV 91.8  PLT 238   Cardiac Enzymes:  Recent Labs Lab 05/16/15 1117 05/16/15 1430 05/16/15 1730 05/16/15 2220 05/17/15 0511  TROPONINI <0.03 <0.03 <0.03 <0.03 <0.03   CBG:  Recent Labs Lab 05/18/15 0740 05/18/15 1135 05/18/15 1708 05/18/15 2202 05/19/15 0804  GLUCAP 165* 184* 196* 211* 113*    Signed:  Vassie Loll  [facility] 05/19/2015, 10:53 AM

## 2015-05-19 NOTE — Progress Notes (Signed)
Dennis PerchesMark Gonzales to be D/Gonzales'd Home per MD order.  Discussed with the patient and all questions fully answered.  VSS, Skin clean, dry and intact without evidence of skin break down, no evidence of skin tears noted. IV catheter discontinued intact. Site without signs and symptoms of complications. Dressing and pressure applied.  An After Visit Summary was printed and given to the patient. Patient received prescription.  D/Gonzales education completed with patient/family including follow up instructions, medication list, d/Gonzales activities limitations if indicated, with other d/Gonzales instructions as indicated by MD - patient able to verbalize understanding, all questions fully answered.   Patient instructed to return to ED, call 911, or call MD for any changes in condition.   Patient escorted via WC, and D/Gonzales home via private auto.  L'ESPERANCE, Dennis Gonzales 05/19/2015 12:12 PM

## 2015-05-19 NOTE — Care Management Important Message (Signed)
Important Message  Patient Details  Name: Tora PerchesMark Alderfer MRN: [id number] Date of Birth: December 06, 1960   Medicare Important Message Given:  Yes    Kanai Berrios Abena 05/19/2015, 1:10 PM

## 2015-06-01 NOTE — Progress Notes (Signed)
PT G-code Note    06/01/15 0800  PT G-Codes **NOT FOR INPATIENT CLASS**  Functional Assessment Tool Used Clinical Observation  Functional Limitation Mobility: Walking and moving around  Mobility: Walking and Moving Around Current Status [phone](G8978) CI  Mobility: Walking and Moving Around Goal Status [phone](G8979) CI   Sykestonyndi Ranjit [place], [place] Carolina[place] [phone] 05/17/2015

## 2015-06-01 NOTE — Progress Notes (Signed)
   05/17/15 1306  SLP G-Codes **NOT FOR INPATIENT CLASS**  Functional Assessment Tool Used skilled clinical judgement   Functional Limitations Swallowing  Swallow Current Status (E4540(G8996) CI  Swallow Goal Status (J8119(G8997) CI  Swallow Discharge Status (J4782(G8998) CI  SLP Evaluations  $ SLP Speech Visit 1 Procedure  SLP Evaluations  $BSS Swallow 1 Procedure

## 2015-06-03 NOTE — Progress Notes (Signed)
Late entry due to missed g-code.  06/03/15 1500  OT G-codes **NOT FOR INPATIENT CLASS**  Functional Assessment Tool Used (clinical judgement)  Functional Limitation Self care  Self Care Current Status [phone](G8987) CI  Self Care Goal Status (L2440(G8988) CI  Self Care Discharge Status [phone](G8989) CI  06/03/2015 Martie RoundJulie Novalyn Lajara, OTR/L Pager: [phone]
